# Patient Record
Sex: Female | Born: 1980 | Race: White | Hispanic: No | Smoking: Former smoker
Health system: Southern US, Community
[De-identification: ages and names within clinical notes are randomized; demographics above are authoritative.]

## PROBLEM LIST (undated history)

## (undated) DIAGNOSIS — F419 Anxiety disorder, unspecified: Secondary | ICD-10-CM

## (undated) DIAGNOSIS — J45909 Unspecified asthma, uncomplicated: Secondary | ICD-10-CM

## (undated) HISTORY — PX: OTHER SURGICAL HISTORY: SHX169

---

## 2000-08-20 ENCOUNTER — Other Ambulatory Visit: Admission: RE | Admit: 2000-08-20 | Discharge: 2000-08-20 | Payer: Self-pay | Admitting: Obstetrics and Gynecology

## 2001-09-23 ENCOUNTER — Other Ambulatory Visit: Admission: RE | Admit: 2001-09-23 | Discharge: 2001-09-23 | Payer: Self-pay | Admitting: Obstetrics and Gynecology

## 2001-10-12 ENCOUNTER — Encounter: Payer: Self-pay | Admitting: Emergency Medicine

## 2001-10-12 ENCOUNTER — Emergency Department (HOSPITAL_COMMUNITY): Admission: EM | Admit: 2001-10-12 | Discharge: 2001-10-12 | Payer: Self-pay

## 2002-01-29 ENCOUNTER — Encounter: Payer: Self-pay | Admitting: Emergency Medicine

## 2002-01-29 ENCOUNTER — Emergency Department (HOSPITAL_COMMUNITY): Admission: EM | Admit: 2002-01-29 | Discharge: 2002-01-29 | Payer: Self-pay | Admitting: Emergency Medicine

## 2002-05-24 ENCOUNTER — Emergency Department (HOSPITAL_COMMUNITY): Admission: EM | Admit: 2002-05-24 | Discharge: 2002-05-24 | Payer: Self-pay | Admitting: *Deleted

## 2002-07-14 ENCOUNTER — Other Ambulatory Visit: Admission: RE | Admit: 2002-07-14 | Discharge: 2002-07-14 | Payer: Self-pay | Admitting: Obstetrics & Gynecology

## 2003-07-24 ENCOUNTER — Encounter: Payer: Self-pay | Admitting: Emergency Medicine

## 2003-07-24 ENCOUNTER — Emergency Department (HOSPITAL_COMMUNITY): Admission: EM | Admit: 2003-07-24 | Discharge: 2003-07-24 | Payer: Self-pay | Admitting: Emergency Medicine

## 2003-07-27 ENCOUNTER — Other Ambulatory Visit: Admission: RE | Admit: 2003-07-27 | Discharge: 2003-07-27 | Payer: Self-pay | Admitting: Obstetrics & Gynecology

## 2004-10-24 ENCOUNTER — Other Ambulatory Visit: Admission: RE | Admit: 2004-10-24 | Discharge: 2004-10-24 | Payer: Self-pay | Admitting: Obstetrics and Gynecology

## 2005-03-12 ENCOUNTER — Observation Stay (HOSPITAL_COMMUNITY): Admission: AD | Admit: 2005-03-12 | Discharge: 2005-03-13 | Payer: Self-pay | Admitting: Obstetrics & Gynecology

## 2005-05-03 ENCOUNTER — Inpatient Hospital Stay (HOSPITAL_COMMUNITY): Admission: AD | Admit: 2005-05-03 | Discharge: 2005-05-03 | Payer: Self-pay | Admitting: Obstetrics and Gynecology

## 2005-05-04 ENCOUNTER — Emergency Department (HOSPITAL_COMMUNITY): Admission: EM | Admit: 2005-05-04 | Discharge: 2005-05-04 | Payer: Self-pay | Admitting: Emergency Medicine

## 2005-07-25 ENCOUNTER — Inpatient Hospital Stay (HOSPITAL_COMMUNITY): Admission: AD | Admit: 2005-07-25 | Discharge: 2005-07-29 | Payer: Self-pay | Admitting: Obstetrics & Gynecology

## 2005-08-01 ENCOUNTER — Encounter: Admission: RE | Admit: 2005-08-01 | Discharge: 2005-08-31 | Payer: Self-pay | Admitting: Obstetrics & Gynecology

## 2005-10-30 ENCOUNTER — Other Ambulatory Visit: Admission: RE | Admit: 2005-10-30 | Discharge: 2005-10-30 | Payer: Self-pay | Admitting: Obstetrics and Gynecology

## 2009-12-15 ENCOUNTER — Inpatient Hospital Stay (HOSPITAL_COMMUNITY): Admission: AD | Admit: 2009-12-15 | Discharge: 2009-12-16 | Payer: Self-pay | Admitting: Obstetrics & Gynecology

## 2010-10-28 ENCOUNTER — Inpatient Hospital Stay (HOSPITAL_COMMUNITY): Admission: AD | Admit: 2010-10-28 | Discharge: 2010-10-31 | Payer: Self-pay | Admitting: Obstetrics and Gynecology

## 2010-10-28 ENCOUNTER — Encounter (INDEPENDENT_AMBULATORY_CARE_PROVIDER_SITE_OTHER): Payer: Self-pay | Admitting: Obstetrics and Gynecology

## 2010-11-05 ENCOUNTER — Observation Stay (HOSPITAL_COMMUNITY): Admission: AD | Admit: 2010-11-05 | Discharge: 2010-11-07 | Payer: Self-pay | Admitting: Obstetrics and Gynecology

## 2011-03-13 LAB — DIFFERENTIAL
Basophils Absolute: 0 10*3/uL (ref 0.0–0.1)
Basophils Relative: 0 % (ref 0–1)
Eosinophils Absolute: 0 10*3/uL (ref 0.0–0.7)
Lymphs Abs: 0.8 10*3/uL (ref 0.7–4.0)
Monocytes Absolute: 0.6 10*3/uL (ref 0.1–1.0)
Monocytes Relative: 5 % (ref 3–12)
Neutrophils Relative %: 87 % — ABNORMAL HIGH (ref 43–77)

## 2011-03-13 LAB — URINE CULTURE
Colony Count: NO GROWTH
Culture  Setup Time: 201111061911
Culture: NO GROWTH

## 2011-03-13 LAB — CBC
HCT: 26.6 % — ABNORMAL LOW (ref 36.0–46.0)
Hemoglobin: 8.9 g/dL — ABNORMAL LOW (ref 12.0–15.0)
MCH: 29.6 pg (ref 26.0–34.0)
MCHC: 33.7 g/dL (ref 30.0–36.0)

## 2011-03-13 LAB — URINALYSIS, ROUTINE W REFLEX MICROSCOPIC
Ketones, ur: NEGATIVE mg/dL
Urobilinogen, UA: 0.2 mg/dL (ref 0.0–1.0)
pH: 5.5 (ref 5.0–8.0)

## 2011-03-13 LAB — CULTURE, BLOOD (ROUTINE X 2): Culture: NO GROWTH

## 2011-03-13 LAB — COMPREHENSIVE METABOLIC PANEL
ALT: 22 U/L (ref 0–35)
AST: 16 U/L (ref 0–37)
Alkaline Phosphatase: 103 U/L (ref 39–117)
BUN: 6 mg/dL (ref 6–23)
CO2: 23 mEq/L (ref 19–32)
Chloride: 107 mEq/L (ref 96–112)
GFR calc Af Amer: >60 mL/min (ref >60)
Sodium: 138 mEq/L (ref 135–145)
Total Protein: 6.2 g/dL (ref 6.0–8.3)

## 2011-03-13 LAB — GC/CHLAMYDIA PROBE AMP, GENITAL: Chlamydia, DNA Probe: NEGATIVE

## 2011-03-13 LAB — WOUND CULTURE

## 2011-03-14 LAB — CBC
HCT: 23.5 % — ABNORMAL LOW (ref 36.0–46.0)
HCT: 31.4 % — ABNORMAL LOW (ref 36.0–46.0)
Hemoglobin: 10.4 g/dL — ABNORMAL LOW (ref 12.0–15.0)
Hemoglobin: 8 g/dL — ABNORMAL LOW (ref 12.0–15.0)
MCHC: 33.8 g/dL (ref 30.0–36.0)
MCV: 88.9 fL (ref 78.0–100.0)
Platelets: 133 10*3/uL — ABNORMAL LOW (ref 150–400)
Platelets: 175 10*3/uL (ref 150–400)
RBC: 3.53 MIL/uL — ABNORMAL LOW (ref 3.87–5.11)
RDW: 13.2 % (ref 11.5–15.5)

## 2011-03-24 IMAGING — US US RENAL
1 series · 14 of 25 positions shown · non-contrast
Comparison: None

CLINICAL DATA: Right flank pain and renal infection.

RENAL/URINARY TRACT ULTRASOUND COMPLETE

[Series 1: us renal · 0.24mm/px · 14 of 28 slices shown]
[im 1/28]
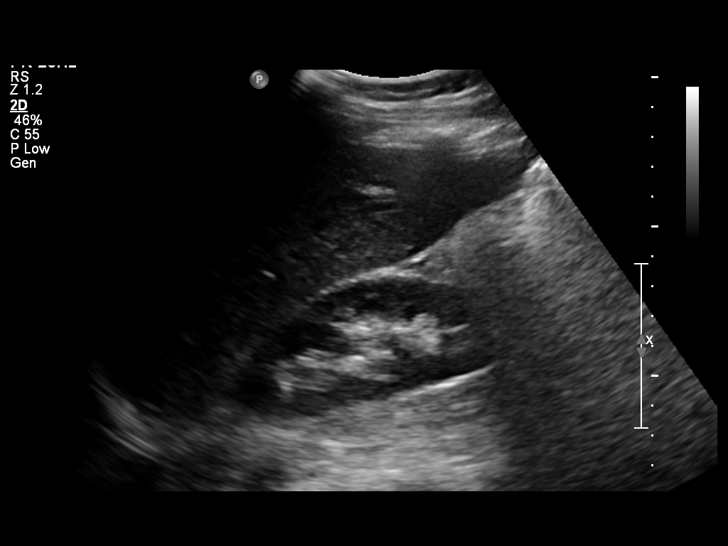
[im 3/28]
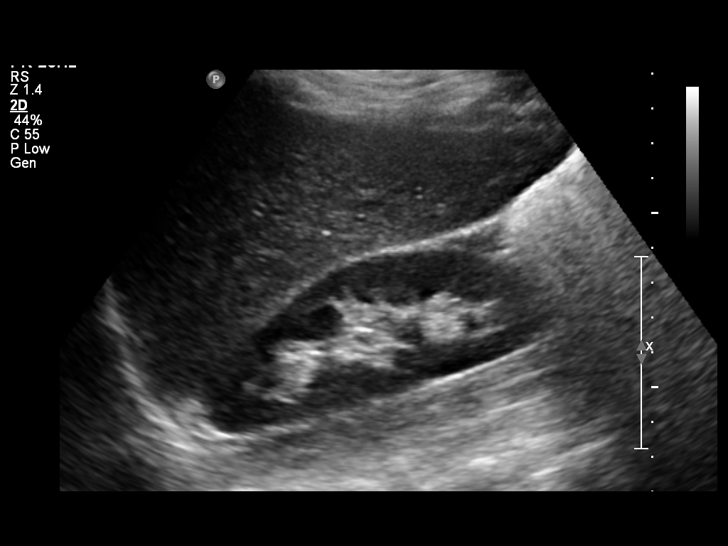
[im 5/28]
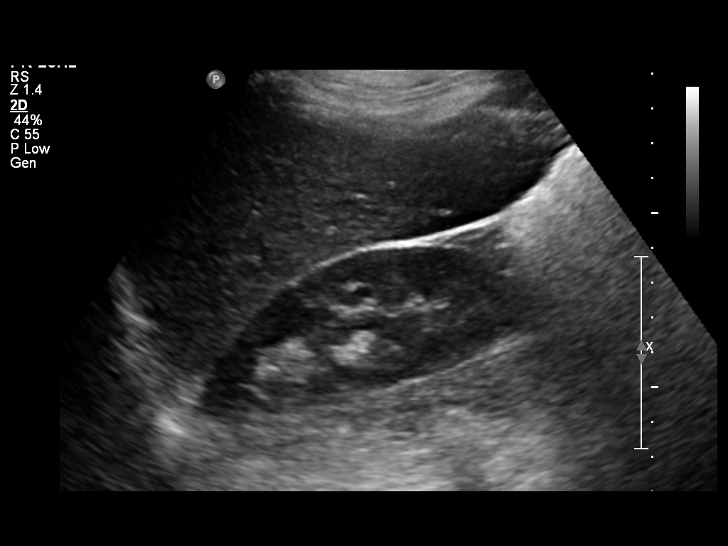
[im 7/28]
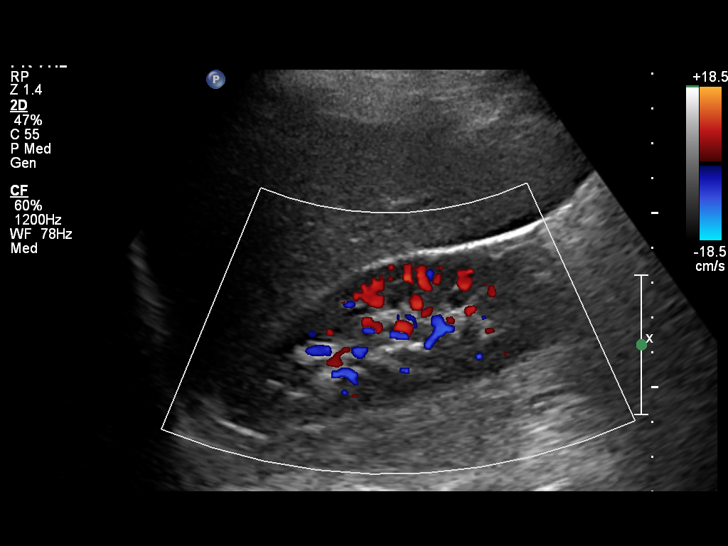
[im 10/28]
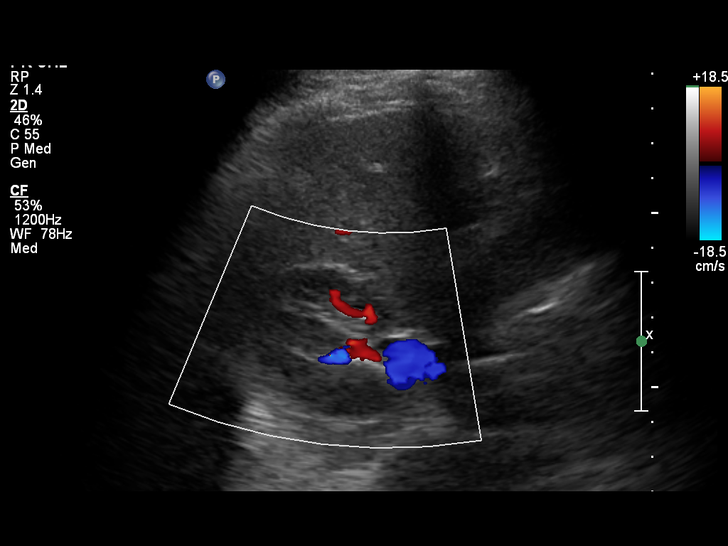
[im 11/28]
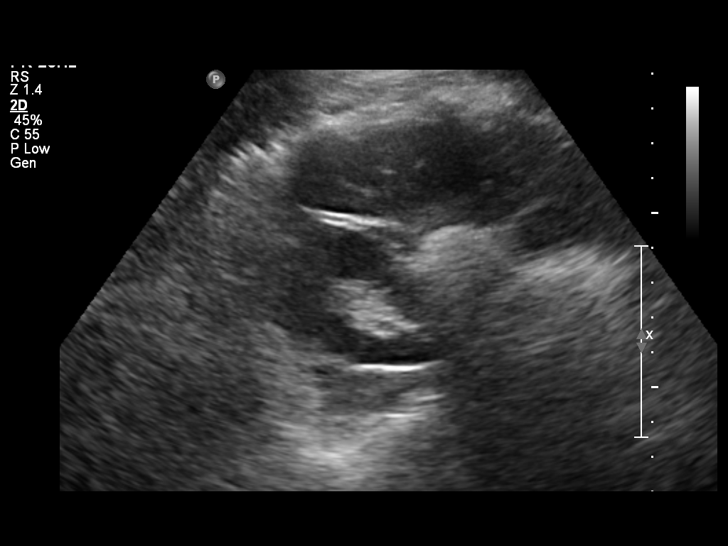
[im 13/28]
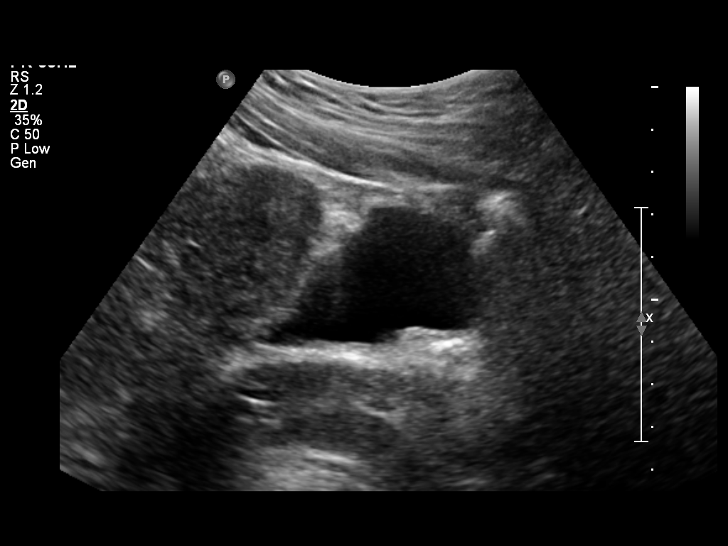
[im 15/28]
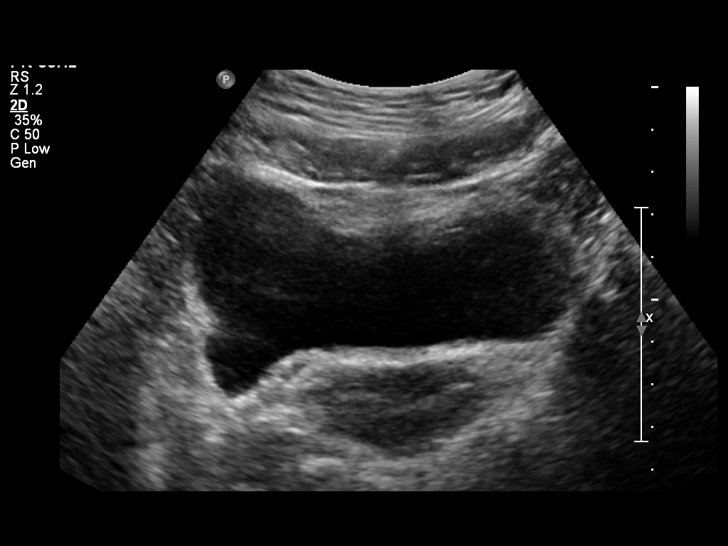
[im 17/28]
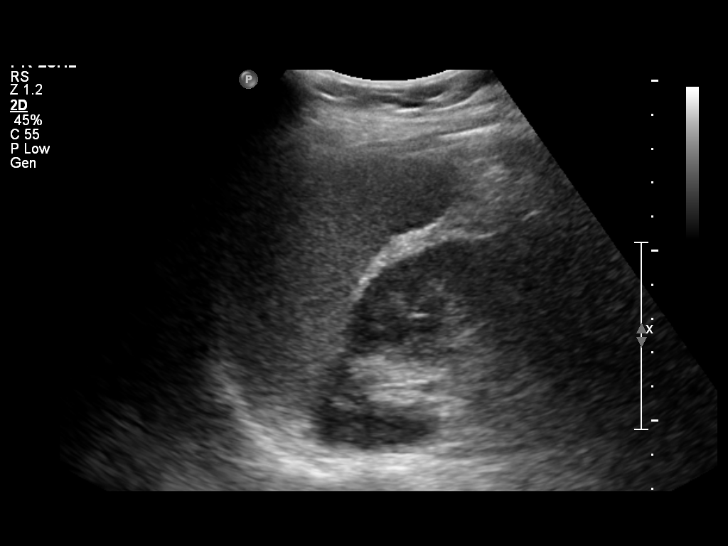
[im 19/28]
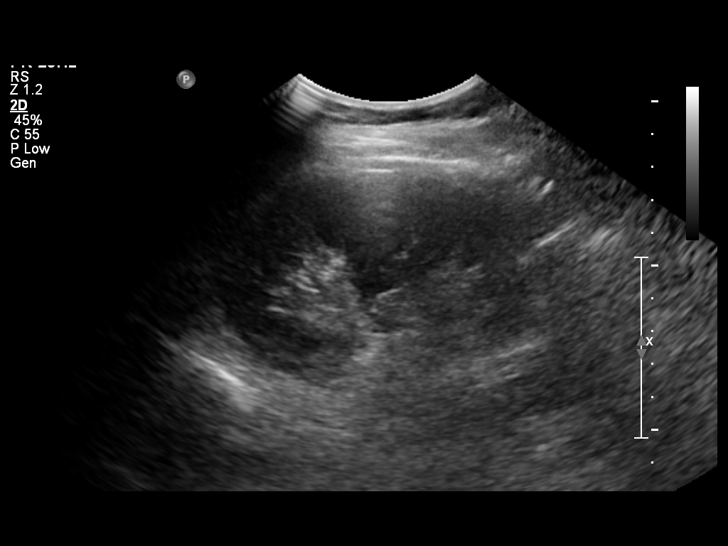
[im 21/28]
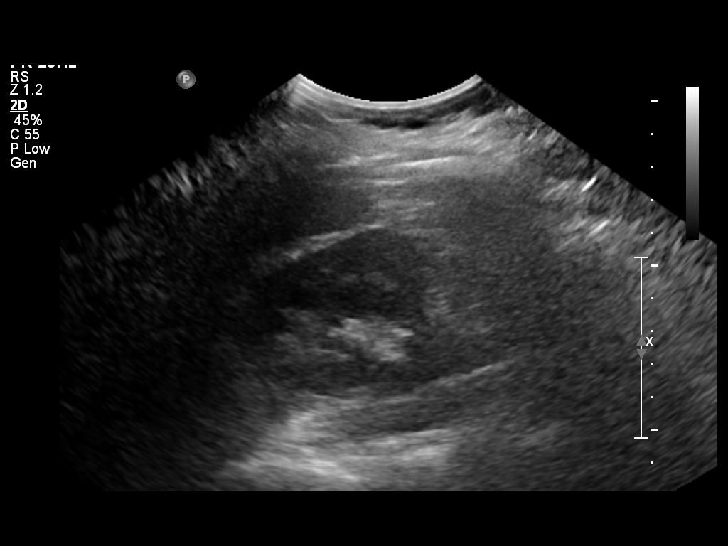
[im 23/28]
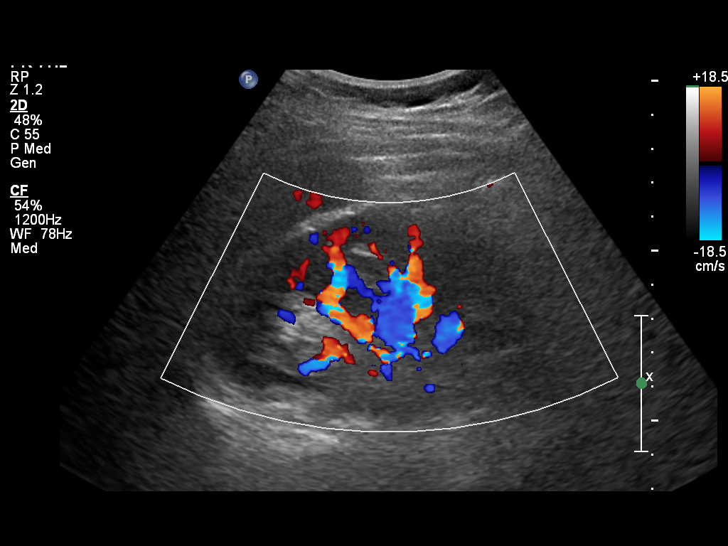
[im 25/28]
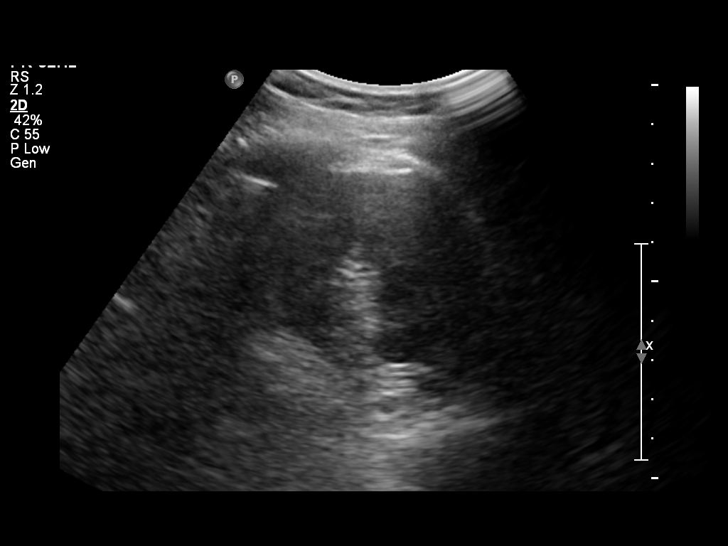
[im 28/28]
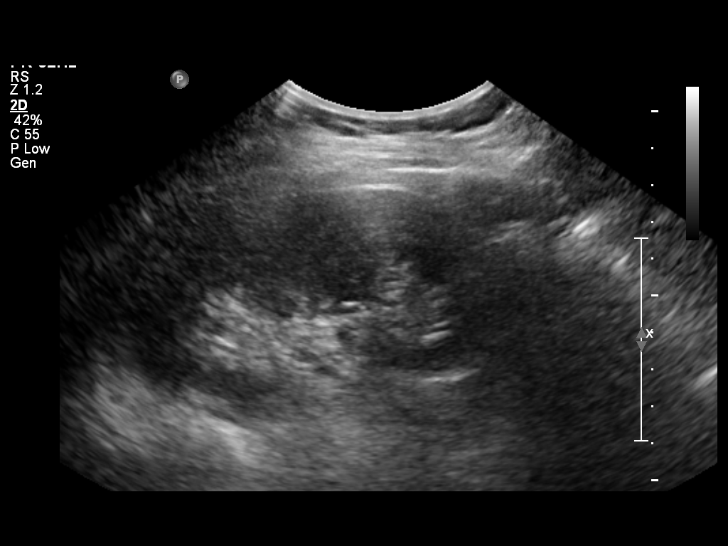

[14 of 25 positions shown; findings below may reference images not displayed]

FINDINGS: Kidneys:  The kidneys bilaterally are normal in echogenicity and
size.
Both kidneys measure 10.4 cm in greatest longitudinal dimension.
There is no evidence of hydronephrosis, solid renal masses, or
definite renal calculi.

Bladder:  The bladder is normal for degree of filling.
Bilateral ureteral jets are identified.
IMPRESSION: Unremarkable renal ultrasound.

## 2011-03-24 IMAGING — US US PELVIS COMPLETE MODIFY
1 series · 14 of 25 positions shown · non-contrast
Comparison: None

CLINICAL DATA: 29-year-old female with pelvic pain.

TRANSABDOMINAL AND TRANSVAGINAL ULTRASOUND OF PELVIS
TECHNIQUE: Both transabdominal and transvaginal ultrasound
examinations of the pelvis were performed including evaluation of
the uterus, ovaries, adnexal regions, and pelvic cul-de-sac.

[Series 1: us pelvis complete modify · 0.24mm/px · 14 of 59 slices shown]
[im 1/59]
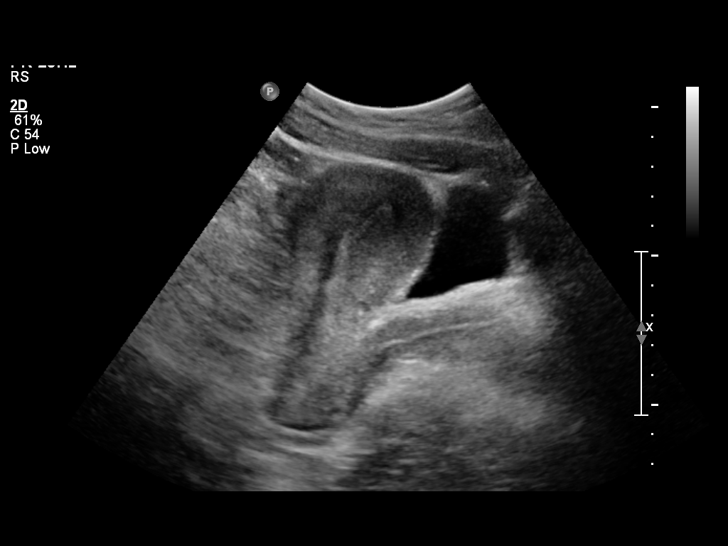
[im 5/59]
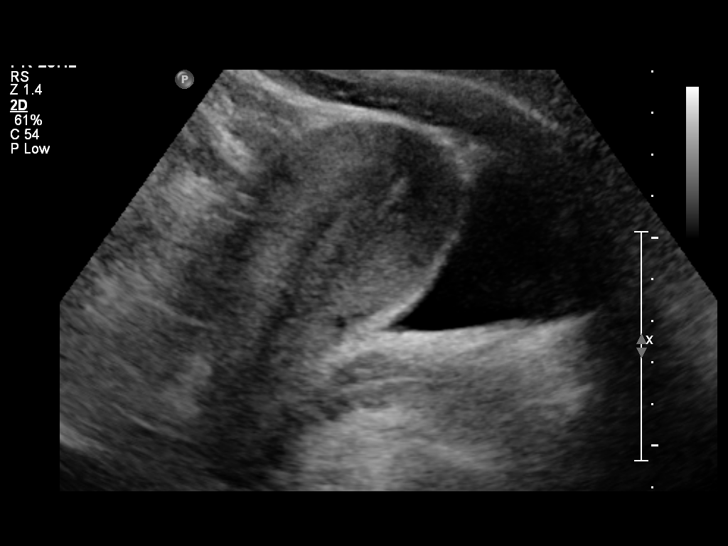
[im 10/59]
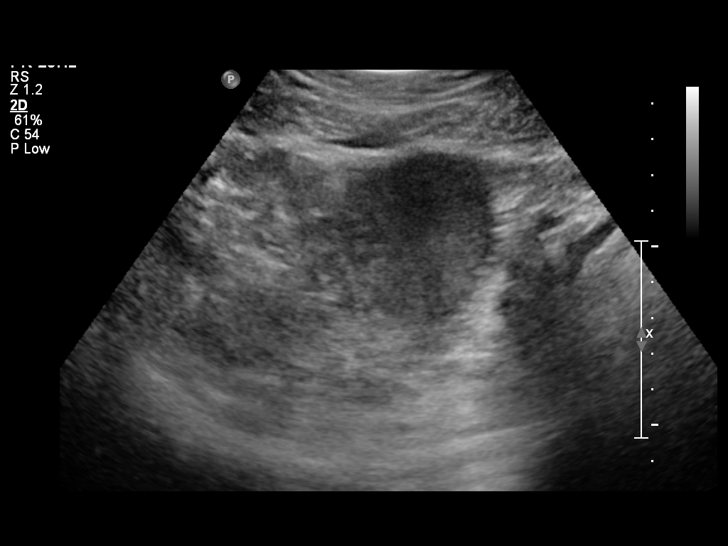
[im 15/59]
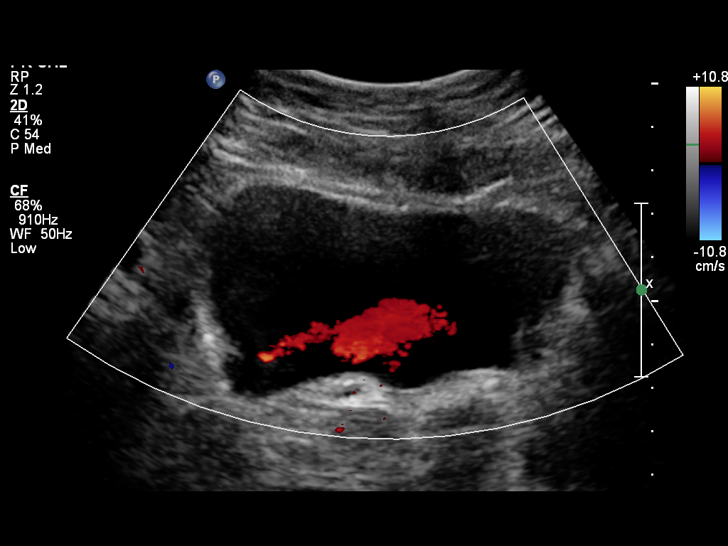
[im 20/59]
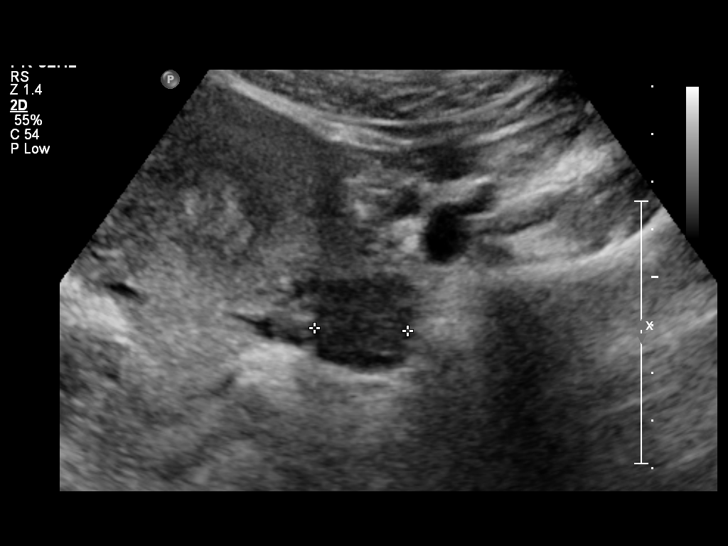
[im 22/59]
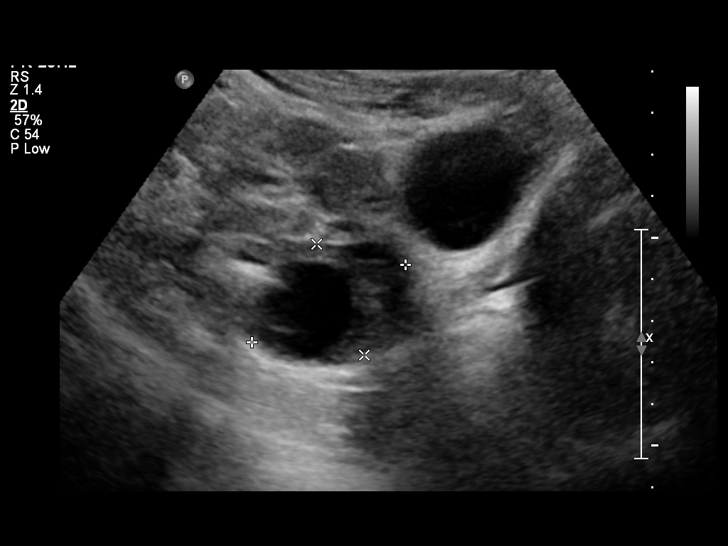
[im 27/59]
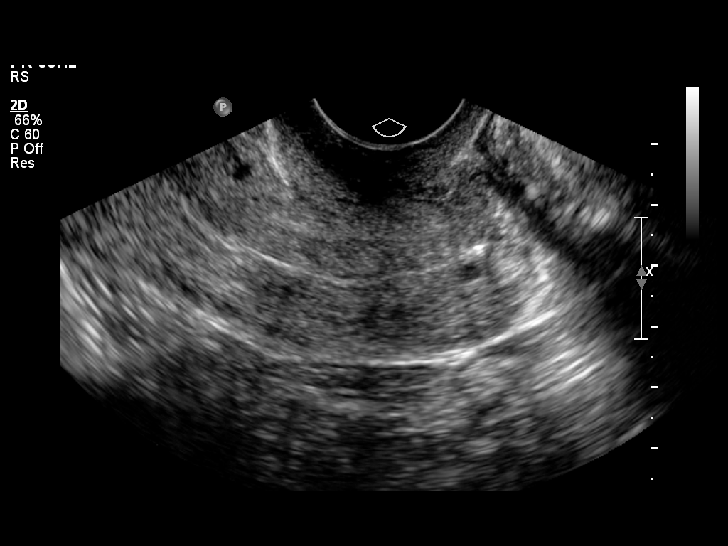
[im 32/59]
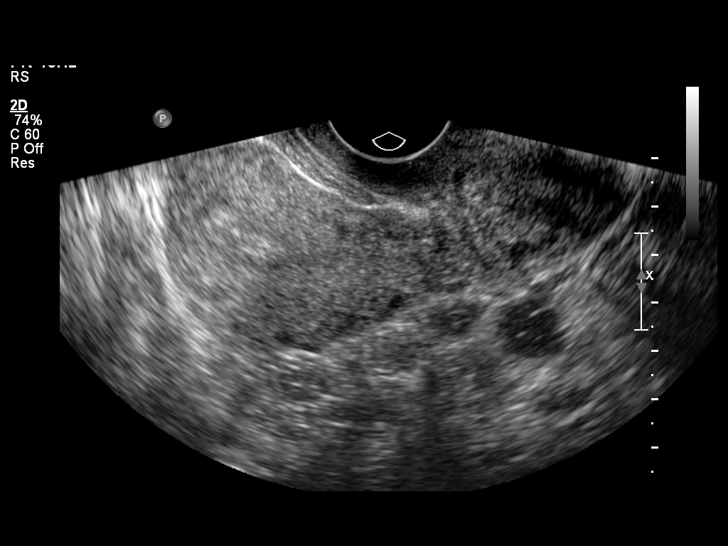
[im 37/59]
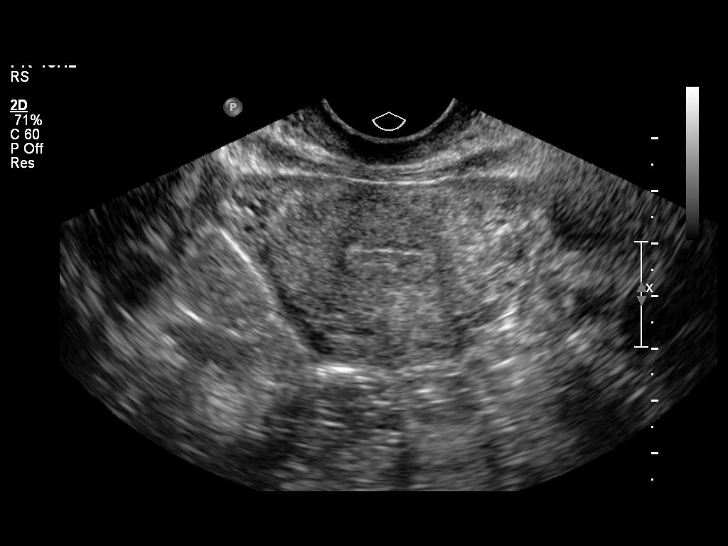
[im 39/59]
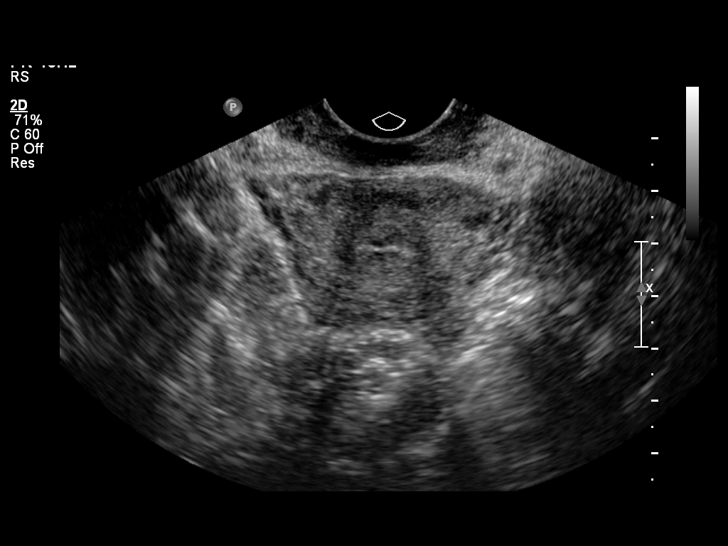
[im 44/59]
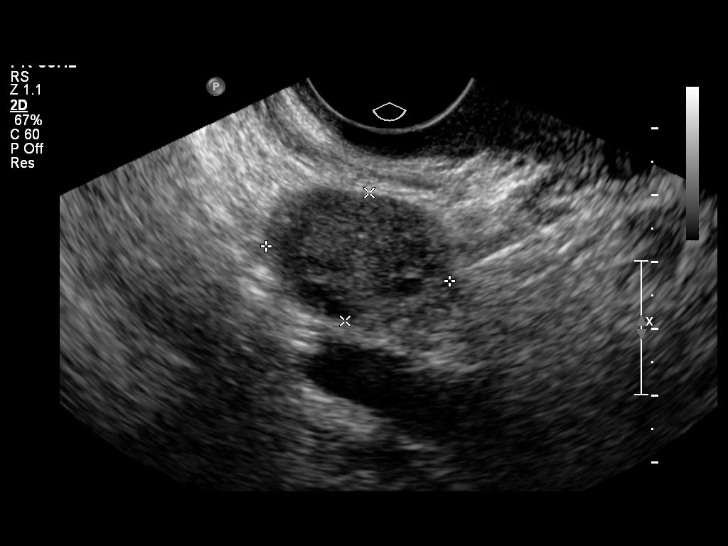
[im 49/59]
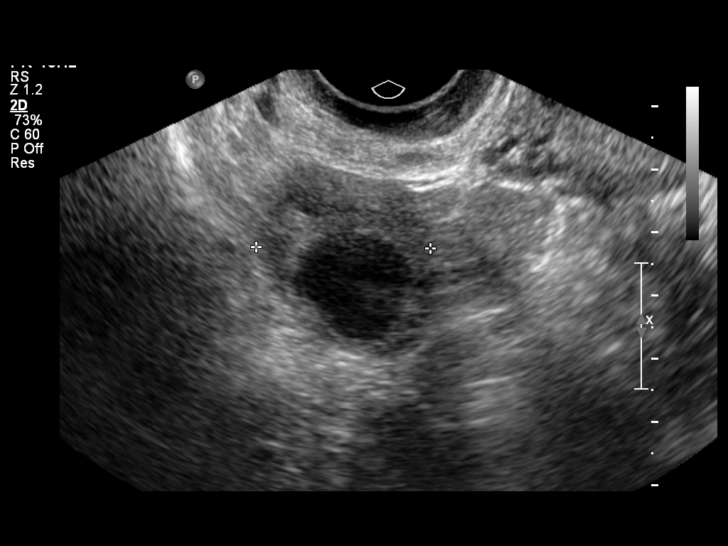
[im 54/59]
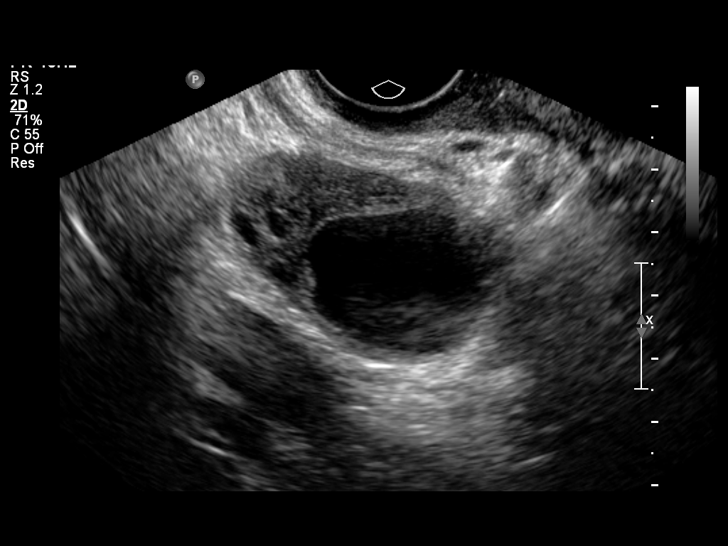
[im 59/59]
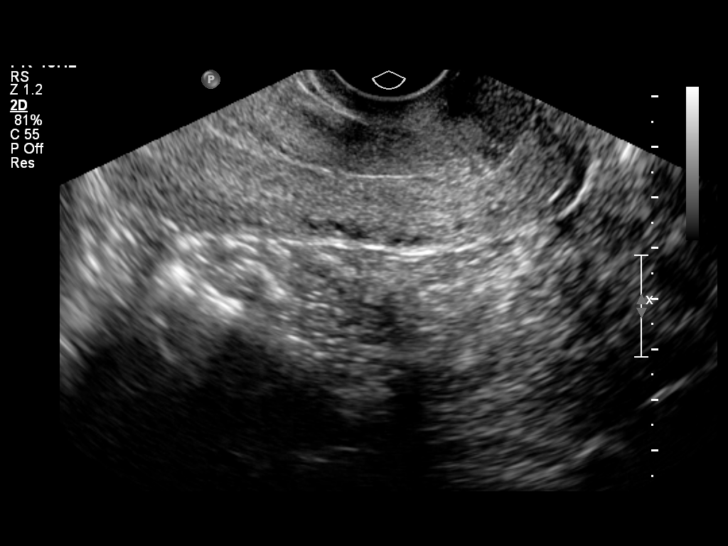

[14 of 25 positions shown; findings below may reference images not displayed]

FINDINGS: Uterus is anteverted and unremarkable measuring 9.4 x 4.6 x 5.6 cm.
No focal uterine lesions are identified.

Endometrium is trilaminar measuring 10 mm in diameter.

Right Ovary contains a 2.9 x 2.7 cm cyst / dominant follicle.  The
right ovary is otherwise unremarkable.

Left Ovary is unremarkable.

Other Findings:  There is no evidence of adnexal mass or free
fluid.
IMPRESSION: Unremarkable pelvic ultrasound except for 2.9 x 2.7 cm right
ovarian cyst / dominant follicle.

## 2011-04-02 LAB — COMPREHENSIVE METABOLIC PANEL
Alkaline Phosphatase: 52 U/L (ref 39–117)
CO2: 27 mEq/L (ref 19–32)
Calcium: 9 mg/dL (ref 8.4–10.5)
GFR calc Af Amer: >60 mL/min (ref >60)
Glucose, Bld: 83 mg/dL (ref 70–99)
Potassium: 4.1 mEq/L (ref 3.5–5.1)

## 2011-04-02 LAB — WET PREP, GENITAL: Yeast Wet Prep HPF POC: NONE SEEN

## 2011-04-02 LAB — GC/CHLAMYDIA PROBE AMP, GENITAL
Chlamydia, DNA Probe: NEGATIVE
GC Probe Amp, Genital: NEGATIVE

## 2011-04-02 LAB — CBC
HCT: 36.4 % (ref 36.0–46.0)
Hemoglobin: 12.5 g/dL (ref 12.0–15.0)
MCHC: 34.3 g/dL (ref 30.0–36.0)
MCV: 88.8 fL (ref 78.0–100.0)
RBC: 4.1 MIL/uL (ref 3.87–5.11)
RDW: 12.2 % (ref 11.5–15.5)
WBC: 6.7 10*3/uL (ref 4.0–10.5)

## 2011-04-02 LAB — URINALYSIS, ROUTINE W REFLEX MICROSCOPIC
Bilirubin Urine: NEGATIVE
Nitrite: NEGATIVE
Protein, ur: NEGATIVE mg/dL
Urobilinogen, UA: 0.2 mg/dL (ref 0.0–1.0)

## 2012-05-25 ENCOUNTER — Encounter (HOSPITAL_COMMUNITY): Payer: Self-pay | Admitting: *Deleted

## 2012-05-25 ENCOUNTER — Emergency Department (HOSPITAL_COMMUNITY)
Admission: EM | Admit: 2012-05-25 | Discharge: 2012-05-25 | Disposition: A | Discharge status: Home / Self Care | Payer: Self-pay | Attending: Emergency Medicine | Admitting: Emergency Medicine

## 2012-05-25 DIAGNOSIS — R109 Unspecified abdominal pain: Secondary | ICD-10-CM | POA: Insufficient documentation

## 2012-05-25 DIAGNOSIS — K625 Hemorrhage of anus and rectum: Secondary | ICD-10-CM | POA: Insufficient documentation

## 2012-05-25 LAB — CBC
HCT: 40.8 % (ref 36.0–46.0)
MCHC: 35.3 g/dL (ref 30.0–36.0)
MCV: 86.8 fL (ref 78.0–100.0)
RDW: 12.5 % (ref 11.5–15.5)

## 2012-05-25 LAB — URINALYSIS, ROUTINE W REFLEX MICROSCOPIC
Bilirubin Urine: NEGATIVE
Glucose, UA: NEGATIVE mg/dL
Ketones, ur: NEGATIVE mg/dL
Protein, ur: 30 mg/dL — AB

## 2012-05-25 LAB — COMPREHENSIVE METABOLIC PANEL
AST: 14 U/L (ref 0–37)
Albumin: 3.9 g/dL (ref 3.5–5.2)
BUN: 13 mg/dL (ref 6–23)
CO2: 24 mEq/L (ref 19–32)
Calcium: 9.1 mg/dL (ref 8.4–10.5)
Creatinine, Ser: 0.79 mg/dL (ref 0.50–1.10)
GFR calc non Af Amer: >90 mL/min (ref >90)

## 2012-05-25 LAB — DIFFERENTIAL
Basophils Absolute: 0 10*3/uL (ref 0.0–0.1)
Basophils Relative: 0 % (ref 0–1)
Eosinophils Relative: 2 % (ref 0–5)
Monocytes Absolute: 0.9 10*3/uL (ref 0.1–1.0)

## 2012-05-25 LAB — SAMPLE TO BLOOD BANK

## 2012-05-25 LAB — URINE MICROSCOPIC-ADD ON

## 2012-05-25 MED ORDER — ONDANSETRON 4 MG PO TBDP: ORAL_TABLET | ORAL | Status: AC

## 2012-05-25 NOTE — Discharge Instructions (Signed)
Bloody Stools Bloody stools often mean that there is a problem in the digestive tract. Your caregiver may use the term "melena" to describe black, tarry, and bad smelling stools or "hematochezia" to describe red or maroon-colored stools. Blood seen in the stool can be caused by bleeding anywhere along the intestinal tract.  A black stool usually means that blood is coming from the upper part of the gastrointestinal tract (esophagus, stomach, or small bowel). Passing maroon-colored stools or bright red blood usually means that blood is coming from lower down in the large bowel or the rectum. However, sometimes massive bleeding in the stomach or small intestine can cause bright red bloody stools.  Consuming black licorice, lead, iron pills, medicines containing bismuth subsalicylate, or blueberries can also cause black stools. Your caregiver can test black stools to see if blood is present. It is important that the cause of the bleeding be found. Treatment can then be started, and the problem can be corrected. Rectal bleeding may not be serious, but you should not assume everything is okay until you know the cause.It is very important to follow up with your caregiver or a specialist in gastrointestinal problems. CAUSES  Blood in the stools can come from various underlying causes.Often, the cause is not found during your first visit. Testing is often needed to discover the cause of bleeding in the gastrointestinal tract. Causes range from simple to serious or even life-threatening.Possible causes include:  Hemorrhoids.These are veins that are full of blood (engorged) in the rectum. They cause pain, inflammation, and may bleed.   Anal fissures.These are areas of painful tearing which may bleed. They are often caused by passing hard stool.   Diverticulosis.These are pouches that form on the colon over time, with age, and may bleed significantly.   Diverticulitis.This is inflammation in areas with  diverticulosis. It can cause pain, fever, and bloody stools, although bleeding is rare.   Proctitis and colitis. These are inflamed areas of the rectum or colon. They may cause pain, fever, and bloody stools.   Polyps and cancer. Colon cancer is a leading cause of preventable cancer death.It often starts out as precancerous polyps that can be removed during a colonoscopy, preventing progression into cancer. Sometimes, polyps and cancer may cause rectal bleeding.   Gastritis and ulcers.Bleeding from the upper gastrointestinal tract (near the stomach) may travel through the intestines and produce black, sometimes tarry, often bad smelling stools. In certain cases, if the bleeding is fast enough, the stools may not be black, but red and the condition may be life-threatening.  SYMPTOMS  You may have stools that are bright red and bloody, that are normal color with blood on them, or that are dark black and tarry. In some cases, you may only have blood in the toilet bowl. Any of these cases need medical care. You may also have:  Pain at the anus or anywhere in the rectum.   Lightheadedness or feeling faint.   Extreme weakness.   Nausea or vomiting.   Fever.  DIAGNOSIS Your caregiver may use the following methods to find the cause of your bleeding:  Taking a medical history. Age is important. Older people tend to develop polyps and cancer more often. If there is anal pain and a hard, large stool associated with bleeding, a tear of the anus may be the cause. If blood drips into the toilet after a bowel movement, bleeding hemorrhoids may be the problem. The color and frequency of the bleeding are additional considerations.   In most cases, the medical history provides clues, but seldom the final answer.   A visual and finger (digital) exam. Your caregiver will inspect the anal area, looking for tears and hemorrhoids. A finger exam can provide information when there is tenderness or a growth inside. In  men, the prostate is also examined.   Endoscopy. Several types of small, long scopes (endoscopes) are used to view the colon.   In the office, your caregiver may use a rigid, or more commonly, a flexible viewing sigmoidoscope. This exam is called flexible sigmoidoscopy. It is performed in 5 to 10 minutes.   A more thorough exam is accomplished with a colonoscope. It allows your caregiver to view the entire 5 to 6 foot long colon. Medicine to help you relax (sedative) is usually given for this exam. Frequently, a bleeding lesion may be present beyond the reach of the sigmoidoscope. So, a colonoscopy may be the best exam to start with. Both exams are usually done on an outpatient basis. This means the patient does not stay overnight in the hospital or surgery center.   An upper endoscopy may be needed to examine your stomach. Sedation is used and a flexible endoscope is put in your mouth, down to your stomach.   A barium enema X-ray. This is an X-ray exam. It uses liquid barium inserted by enema into the rectum. This test alone may not identify an actual bleeding point. X-rays highlight abnormal shadows, such as those made by lumps (tumors), diverticuli, or colitis.  TREATMENT  Treatment depends on the cause of your bleeding.   For bleeding from the stomach or colon, the caregiver doing your endoscopy or colonoscopy may be able to stop the bleeding as part of the procedure.   Inflammation or infection of the colon can be treated with medicines.   Many rectal problems can be treated with creams, suppositories, or warm baths.   Surgery is sometimes needed.   Blood transfusions are sometimes needed if you have lost a lot of blood.   For any bleeding problem, let your caregiver know if you take aspirin or other blood thinners regularly.  HOME CARE INSTRUCTIONS   Take any medicines exactly as prescribed.   Keep your stools soft by eating a diet high in fiber. Prunes (1 to 3 a day) work well for  many people.   Drink enough water and fluids to keep your urine clear or pale yellow.   Take sitz baths if advised. A sitz bath is when you sit in a bathtub with warm water for 10 to 15 minutes to soak, soothe, and cleanse the rectal area.   If enemas or suppositories are advised, be sure you know how to use them. Tell your caregiver if you have problems with this.   Monitor your bowel movements to look for signs of improvement or worsening.  SEEK MEDICAL CARE IF:   You do not improve in the time expected.   Your condition worsens after initial improvement.   You develop any new symptoms.  SEEK IMMEDIATE MEDICAL CARE IF:   You develop severe or prolonged rectal bleeding.   You vomit blood.   You feel weak or faint.   You have a fever.  MAKE SURE YOU:  Understand these instructions.   Will watch your condition.   Will get help right away if you are not doing well or get worse.  Document Released: 12/07/2002 Document Revised: 12/06/2011 Document Reviewed: 05/04/2011 [employer] Patient Information 2012 [employer].Rectal Bleeding    Rectal bleeding is when blood comes out of the opening of the butt (anus). Rectal bleeding may show up as bright red blood or really dark poop (stool). The poop may look dark red, maroon, or black. Rectal bleeding is often a sign that something is wrong. This needs to be checked by a doctor.  HOME CARE  Eat a diet high in fiber. This will help keep your poop soft.   Limit activitiy.   Drink enough fluids to keep your pee (urine) clear or pale yellow.   Take a warm bath to soothe any pain.   Follow up with your doctor as told.  GET HELP RIGHT AWAY IF:  You have more bleeding.   You have black or dark red poop.   You throw up (vomit) blood or it looks like coffee grounds.   You have belly (abdominal) pain or tenderness.   You have a fever.   You feel weak, sick to your stomach (nauseous), or you pass out (faint).   You have pain that  is so bad you cannot poop (bowel movement).  MAKE SURE YOU:  Understand these instructions.   Will watch your condition.   Will get help right away if you are not doing well or get worse.  Document Released: 08/29/2011 Document Revised: 12/06/2011 Document Reviewed: 08/29/2011 [employer] Patient Information 2012 [employer]. 

## 2012-05-25 NOTE — ED Notes (Signed)
abd pain since this am with nv and bloody diarhea.  More clots also.  lmp today

## 2012-05-25 NOTE — ED Provider Notes (Signed)
History     CSN: [id number]  Arrival date & time 05/25/12  1944   First MD Initiated Contact with Patient 05/25/12 2124      Chief Complaint  Patient presents with  . Abdominal Pain    (Consider location/radiation/quality/duration/timing/severity/associated sxs/prior treatment) HPI Comments: Pt with abdominal cramping and BRBPR today.  No lightheadedness or weakness.  Has vomited several times with no blood in emesis.  Otherwise doing well.  Patient is a 32 y.o. female presenting with hematochezia. The history is provided by the patient.  Rectal Bleeding  The current episode started today. The problem occurs occasionally. The problem has been unchanged. The pain is mild (lower abdominal cramping). The stool is described as soft and mixed with blood. There was no prior successful therapy. There was no prior unsuccessful therapy. Pertinent negatives include no abdominal pain, no chest pain and no rash. She has been behaving normally.    History reviewed. No pertinent past medical history.  History reviewed. No pertinent past surgical history.  No family history on file.  History  Substance Use Topics  . Smoking status: Current Everyday Smoker  . Smokeless tobacco: Not on file  . Alcohol Use: Yes    OB History    Grav Para Term Preterm Abortions TAB SAB Ect Mult Living                  Review of Systems  Constitutional: Negative for activity change.  HENT: Negative for congestion.   Eyes: Negative for visual disturbance.  Respiratory: Negative for chest tightness and shortness of breath.   Cardiovascular: Negative for chest pain and leg swelling.  Gastrointestinal: Positive for hematochezia. Negative for abdominal pain.  Genitourinary: Negative for dysuria.  Skin: Negative for rash.  Neurological: Negative for syncope.  Psychiatric/Behavioral: Negative for behavioral problems.    Allergies  Shellfish allergy and Codeine  Home Medications   Current Outpatient Rx   Name Route Sig Dispense Refill  . BC HEADACHE POWDER PO Oral Take 1 Package by mouth daily.    . IBUPROFEN 400 MG PO TABS Oral Take 400 mg by mouth every 6 (six) hours as needed. For pain    . LEVONORGESTREL-ETHINYL ESTRAD 0.1-20 MG-MCG PO TABS Oral Take 1 tablet by mouth daily.    Marland Kitchen ONDANSETRON 4 MG PO TBDP  4mg  ODT q4 hours prn nausea/vomit 12 tablet 0    BP 116/77  Pulse 77  Temp(Src) 98.5 F (36.9 C) (Oral)  Resp 16  SpO2 99%  LMP 05/25/2012  Physical Exam  Constitutional: She is oriented to person, place, and time. She appears well-developed and well-nourished.  HENT:  Head: Normocephalic and atraumatic.  Eyes: Conjunctivae and EOM are normal. Pupils are equal, round, and reactive to light. No scleral icterus.  Neck: Normal range of motion. Neck supple.  Cardiovascular: Normal rate and regular rhythm.  Exam reveals no gallop and no friction rub.   No murmur heard. Pulmonary/Chest: Effort normal and breath sounds normal. No respiratory distress. She has no wheezes. She has no rales. She exhibits no tenderness.  Abdominal: Soft. She exhibits no distension and no mass. There is tenderness (mild lower abdominal ttp diffusely). There is no rebound and no guarding.  Genitourinary:       Gross blood on rectal exam  Musculoskeletal: Normal range of motion.  Neurological: She is alert and oriented to person, place, and time. She has normal reflexes. No cranial nerve deficit.  Skin: Skin is warm and dry. No rash noted.  Psychiatric: She has a normal mood and affect. Her behavior is normal. Judgment and thought content normal.    ED Course  Procedures (including critical care time)  Labs Reviewed  URINALYSIS, ROUTINE W REFLEX MICROSCOPIC - Abnormal; Notable for the following:    Color, Urine AMBER (*) BIOCHEMICALS MAY BE AFFECTED BY COLOR   APPearance CLOUDY (*)    Hgb urine dipstick LARGE (*)    Protein, ur 30 (*)    Leukocytes, UA SMALL (*)    All other components within  normal limits  CBC - Abnormal; Notable for the following:    WBC 15.5 (*)    All other components within normal limits  DIFFERENTIAL - Abnormal; Notable for the following:    Neutro Abs 11.7 (*)    All other components within normal limits  COMPREHENSIVE METABOLIC PANEL - Abnormal; Notable for the following:    Glucose, Bld 102 (*)    All other components within normal limits  URINE MICROSCOPIC-ADD ON - Abnormal; Notable for the following:    Squamous Epithelial / LPF FEW (*)    Bacteria, UA MANY (*)    All other components within normal limits  SAMPLE TO BLOOD BANK  PREGNANCY, URINE   No results found.   1. Rectal bleed       MDM  Pt with abdominal cramping and BRBPR today.  No lightheadedness or weakness.  Has vomited several times with no blood in emesis.  Otherwise doing well.  VSS and well appearing.  No clinical evidence of anemia.  Hb 14.  UA with bacteria but no UTI symptoms - will; not treat.  Leukocytosis, vomiting, diarrhea with blood.  Suspect gastroenteritis with secondary bleeding.  Pt brought pictures of bloody BMs to ED - minimal blood in toilet.  Exam not suggestive of appy, ovarian torsion or other acute intraabdominal pathology.  Pt declined pelvic exam.  Gave info for GI f/u if symptoms continue.  Pt comfortable with plan and will follow up.           Barbara Chaco, MD 05/25/12 2231

## 2012-05-25 NOTE — ED Notes (Signed)
Pt states that she has been having abdominal pain. Pt states that she has been having a sharp cramping and intermittant pain in her abdomen and then she feels like she has to go to the bathroom. Pt has small bowel movements with bright red blood in them. Pt denies irregular bowel movements in the past. Pt states she has irregular periods and recently she started bleeding vaginally as well. Pt denies N/V. Pt has bowel sounds present in all quadrants.

## 2012-05-26 NOTE — ED Provider Notes (Signed)
I  reviewed the resident's note and I agree with the findings and plan.     Nelia Shi, MD 05/26/12 [phone]

## 2012-07-14 ENCOUNTER — Encounter (HOSPITAL_COMMUNITY): Payer: Self-pay | Admitting: *Deleted

## 2012-07-14 ENCOUNTER — Emergency Department (INDEPENDENT_AMBULATORY_CARE_PROVIDER_SITE_OTHER)
Admission: EM | Admit: 2012-07-14 | Discharge: 2012-07-14 | Disposition: A | Discharge status: Home / Self Care | Payer: Self-pay | Source: Home / Self Care

## 2012-07-14 DIAGNOSIS — L738 Other specified follicular disorders: Secondary | ICD-10-CM

## 2012-07-14 DIAGNOSIS — L678 Other hair color and hair shaft abnormalities: Secondary | ICD-10-CM

## 2012-07-14 DIAGNOSIS — L739 Follicular disorder, unspecified: Secondary | ICD-10-CM

## 2012-07-14 HISTORY — DX: Unspecified asthma, uncomplicated: J45.909

## 2012-07-14 MED ORDER — MUPIROCIN 2 % EX OINT: TOPICAL_OINTMENT | Freq: Three times a day (TID) | CUTANEOUS | Status: AC

## 2012-07-14 MED ORDER — DOXYCYCLINE HYCLATE 100 MG PO TABS: 100.0000 mg | ORAL_TABLET | Freq: Two times a day (BID) | ORAL | Status: AC

## 2012-07-14 NOTE — ED Provider Notes (Signed)
Barbara Patel is a 32 y.o. female who presents to Urgent Care today for painful bumps in her axilla. This developed over the last few weeks. Initially became so swollen and red that it spontaneously ruptured and drained pus.  She took some leftover Keflex and which helped until it ran out.  She has been shaving and has spread her infection to both axilla.  She denies any fevers or chills and feels well otherwise without any other rashes.  No other people in the household have similar rashes.   PMH reviewed. Otherwise healthy History  Substance Use Topics  . Smoking status: Current Everyday Smoker  . Smokeless tobacco: Not on file  . Alcohol Use: Yes   ROS as above Medications reviewed. No current facility-administered medications for this encounter.   Current Outpatient Prescriptions  Medication Sig Dispense Refill  . Aspirin-Salicylamide-Caffeine (BC HEADACHE POWDER PO) Take 1 Package by mouth daily.      Marland Kitchen doxycycline (VIBRA-TABS) 100 MG tablet Take 1 tablet (100 mg total) by mouth 2 (two) times daily.  28 tablet  0  . ibuprofen (ADVIL,MOTRIN) 400 MG tablet Take 400 mg by mouth every 6 (six) hours as needed. For pain      . levonorgestrel-ethinyl estradiol (SRONYX) 0.1-20 MG-MCG tablet Take 1 tablet by mouth daily.      . mupirocin ointment (BACTROBAN) 2 % Apply topically 3 (three) times daily.  22 g  3    Exam:  BP 136/95  Pulse 86  Temp 99.2 F (37.3 C) (Oral)  Resp 18  SpO2 99% Gen: Well NAD SKIN: Tender erythematous papules in bilateral axilla. Some areas of induration. No large areas of fluctuance or discharge noted.   No results found for this or any previous visit (from the past 24 hour(s)). No results found.  Assessment and Plan: 33 y.o. female with folliculitis and bilateral axilla. Plan to treat with oral doxycycline for up to 2 weeks and topical mupirocin as needed. Provided to patient informational handout on folliculitis. Discussed warning signs or symptoms.  Please see discharge instructions. Patient expresses understanding.     Rodolph Bong, MD 07/14/12 [phone]

## 2012-07-14 NOTE — ED Provider Notes (Signed)
Medical screening examination/treatment/procedure(s) were performed by non-physician practitioner and as supervising physician I was immediately available for consultation/collaboration.  Leslee Home, M.D.   Reuben Likes, MD 07/14/12 2128

## 2012-07-14 NOTE — ED Notes (Signed)
Describes swollen axillary lymph nodes and purulent abscesses to bilat axillae since June 1, which have now results in small, red, "bumps".  Has tried Lotrimin cream, cortisone cream, Calamine lotion, vinegar without relief.

## 2014-10-22 ENCOUNTER — Encounter (HOSPITAL_COMMUNITY): Payer: Self-pay | Admitting: Emergency Medicine

## 2014-10-22 ENCOUNTER — Emergency Department (INDEPENDENT_AMBULATORY_CARE_PROVIDER_SITE_OTHER)
Admission: EM | Admit: 2014-10-22 | Discharge: 2014-10-22 | Disposition: A | Discharge status: Home / Self Care | Payer: Self-pay | Source: Home / Self Care | Attending: Emergency Medicine | Admitting: Emergency Medicine

## 2014-10-22 DIAGNOSIS — L089 Local infection of the skin and subcutaneous tissue, unspecified: Secondary | ICD-10-CM

## 2014-10-22 DIAGNOSIS — S30871A Other superficial bite of abdominal wall, initial encounter: Secondary | ICD-10-CM

## 2014-10-22 DIAGNOSIS — S30861A Insect bite (nonvenomous) of abdominal wall, initial encounter: Principal | ICD-10-CM

## 2014-10-22 MED ORDER — SULFAMETHOXAZOLE-TRIMETHOPRIM 800-160 MG PO TABS: 1.0000 | ORAL_TABLET | Freq: Two times a day (BID) | ORAL | Status: DC

## 2014-10-22 NOTE — ED Provider Notes (Signed)
Medical screening examination/treatment/procedure(s) were performed by non-physician practitioner and as supervising physician I was immediately available for consultation/collaboration.  Kasson Lamere, M.D.  Demetrick Eichenberger C Ayushi Pla, MD 10/22/14 1947 

## 2014-10-22 NOTE — Discharge Instructions (Signed)
Return Sunday for a recheck or sooner if you are getting much worse.

## 2014-10-22 NOTE — ED Notes (Signed)
Patient c/o insect bite on right side of lower abdomen x 2-3 days ago. Area is warm to the touch, red and inflamed. Patient reports she has had muscle cramps and tightness since the bite. Patient has been using Bacitracin ointment on bite. Patient is alert and oriented and in NAD.

## 2014-10-22 NOTE — ED Provider Notes (Signed)
CSN: [id number]     Arrival date & time 10/22/14  1908 History   First MD Initiated Contact with Patient 10/22/14 1935     Chief Complaint  Patient presents with  . Insect Bite   (Consider location/radiation/quality/duration/timing/severity/associated sxs/prior Treatment) HPI Comments: Pt reports waking up with tiny insect bite 2 days ago; over time area around bite has become red and painful; no drainage  Patient is a 34 y.o. female presenting with rash. The history is provided by the patient.  Rash Location:  Torso Torso rash location:  Abd RLQ Quality: redness and swelling   Severity:  Moderate Onset quality:  Gradual Duration:  2 days Timing:  Constant Progression:  Worsening Chronicity:  New Context: insect bite/sting   Relieved by:  Nothing Worsened by:  Contact Ineffective treatments: rubbing alcohol and antibacterial ointment. Associated symptoms: no fever     Past Medical History  Diagnosis Date  . Asthma    Past Surgical History  Procedure Laterality Date  . Cesarean section    . Wrist cyst     No family history on file. History  Substance Use Topics  . Smoking status: Current Every Day Smoker -- 0.50 packs/day  . Smokeless tobacco: Not on file  . Alcohol Use: No   OB History   Grav Para Term Preterm Abortions TAB SAB Ect Mult Living                 Review of Systems  Constitutional: Negative for fever and chills.  Skin: Positive for color change and rash.    Allergies  Shellfish allergy and Codeine  Home Medications   Prior to Admission medications   Medication Sig Start Date End Date Taking? Authorizing Provider  Aspirin-Salicylamide-Caffeine (BC HEADACHE POWDER PO) Take 1 Package by mouth daily.    Historical Provider, MD  ibuprofen (ADVIL,MOTRIN) 400 MG tablet Take 400 mg by mouth every 6 (six) hours as needed. For pain    Historical Provider, MD  levonorgestrel-ethinyl estradiol (SRONYX) 0.1-20 MG-MCG tablet Take 1 tablet by mouth daily.     Historical Provider, MD  sulfamethoxazole-trimethoprim (SEPTRA DS) 800-160 MG per tablet Take 1 tablet by mouth every 12 (twelve) hours. 10/22/14   Cathlyn ParsonsAngela M Marieta Markov, NP   BP 128/87  Pulse 107  Temp(Src) 98.9 F (37.2 C) (Oral)  Resp 16  Ht 5\' 5"  (1.651 m)  Wt 148 lb (67.132 kg)  BMI 24.63 kg/m2  SpO2 99%  LMP 10/22/2014 Physical Exam  Constitutional: She appears well-developed and well-nourished. No distress.  Skin: Skin is warm and dry. There is erythema.     Erythematous area is very tender to palp    ED Course  Procedures (including critical care time) Labs Review Labs Reviewed - No data to display  Imaging Review No results found.   MDM   1. Infected insect bite of abdomen, initial encounter   rx bactrim DS 1 po BID #20    Cathlyn ParsonsAngela M Synda Bagent, NP 10/22/14 1946

## 2020-09-10 ENCOUNTER — Inpatient Hospital Stay (HOSPITAL_COMMUNITY)
Admission: AD | Admit: 2020-09-10 | Discharge: 2020-09-11 | Disposition: A | Discharge status: Home / Self Care | Payer: Medicaid Other | Attending: Obstetrics and Gynecology | Admitting: Obstetrics and Gynecology

## 2020-09-10 ENCOUNTER — Encounter (HOSPITAL_COMMUNITY): Payer: Self-pay | Admitting: Obstetrics and Gynecology

## 2020-09-10 ENCOUNTER — Other Ambulatory Visit: Payer: Self-pay

## 2020-09-10 DIAGNOSIS — O219 Vomiting of pregnancy, unspecified: Secondary | ICD-10-CM | POA: Insufficient documentation

## 2020-09-10 DIAGNOSIS — Z7982 Long term (current) use of aspirin: Secondary | ICD-10-CM | POA: Insufficient documentation

## 2020-09-10 DIAGNOSIS — Z885 Allergy status to narcotic agent status: Secondary | ICD-10-CM | POA: Diagnosis not present

## 2020-09-10 DIAGNOSIS — Z87891 Personal history of nicotine dependence: Secondary | ICD-10-CM | POA: Diagnosis not present

## 2020-09-10 DIAGNOSIS — O09521 Supervision of elderly multigravida, first trimester: Secondary | ICD-10-CM | POA: Insufficient documentation

## 2020-09-10 DIAGNOSIS — Z3A09 9 weeks gestation of pregnancy: Secondary | ICD-10-CM | POA: Diagnosis not present

## 2020-09-10 LAB — URINALYSIS, ROUTINE W REFLEX MICROSCOPIC
Bilirubin Urine: NEGATIVE
Glucose, UA: NEGATIVE mg/dL
Hgb urine dipstick: NEGATIVE
Ketones, ur: NEGATIVE mg/dL
Leukocytes,Ua: NEGATIVE
Nitrite: NEGATIVE
Protein, ur: NEGATIVE mg/dL
Specific Gravity, Urine: >1.03 — ABNORMAL HIGH (ref 1.005–1.030)
pH: 6 (ref 5.0–8.0)

## 2020-09-10 MED ORDER — DOXYLAMINE-PYRIDOXINE 10-10 MG PO TBEC: 2.0000 | DELAYED_RELEASE_TABLET | Freq: Every day | ORAL | 2 refills | Status: DC

## 2020-09-10 MED ORDER — LACTATED RINGERS IV BOLUS: 1000.0000 mL | Freq: Once | INTRAVENOUS | Status: DC

## 2020-09-10 MED ORDER — PROMETHAZINE HCL 25 MG/ML IJ SOLN
25.0000 mg | Freq: Once | INTRAMUSCULAR | Status: AC
  Administered 2020-09-10: 25 mg via INTRAVENOUS
  Filled 2020-09-10: qty 1

## 2020-09-10 MED ORDER — FAMOTIDINE IN NACL 20-0.9 MG/50ML-% IV SOLN
20.0000 mg | Freq: Once | INTRAVENOUS | Status: AC
  Administered 2020-09-10: 20 mg via INTRAVENOUS
  Filled 2020-09-10: qty 50

## 2020-09-10 MED ORDER — M.V.I. ADULT IV INJ
Freq: Once | INTRAVENOUS | Status: AC
  Filled 2020-09-10: qty 10

## 2020-09-10 MED ORDER — PROMETHAZINE HCL 12.5 MG PO TABS: 12.5000 mg | ORAL_TABLET | Freq: Four times a day (QID) | ORAL | 0 refills | Status: DC | PRN

## 2020-09-10 MED ORDER — LACTATED RINGERS IV BOLUS
1000.0000 mL | Freq: Once | INTRAVENOUS | Status: AC
  Administered 2020-09-10: 1000 mL via INTRAVENOUS

## 2020-09-10 NOTE — MAU Note (Signed)
Pt reports to MAU c/o NV. Pt reports she vomited 4+ times today. No bleeding LOF or pain.

## 2020-09-10 NOTE — MAU Provider Note (Signed)
History     CSN: [id number]  Arrival date and time: 09/10/20 1901   First Provider Initiated Contact with Patient 09/10/20 2039      No chief complaint on file.  Barbara Patel is a 40 y.o. F7P1025 at [redacted]w[redacted]d who receives care at [facility].  She presents today for Nausea and Vomiting.  Patient states that she has been have symptoms for the last 2 weeks and states that it is "severe."  Patient reports she has thrown up more than 4 times today.  She reports she ate chili at 2pm and was able to keep it down for about one hour.  She reports she tried to drink ginger ale around 7pm.  She then states she had some water this morning, but "didn't even want it because it taste weird."  She endorses morning sickness in previous pregnancies, but reports this is the worse. She reports her last incident of vomiting was at 6pm.    OB History    Gravida  3   Para  2   Term  1   Preterm  1   AB      Living  3     SAB      TAB      Ectopic      Multiple      Live Births  3           Past Medical History:  Diagnosis Date  . Asthma     Past Surgical History:  Procedure Laterality Date  . CESAREAN SECTION    . wrist cyst      History reviewed. No pertinent family history.  Social History   Tobacco Use  . Smoking status: Former Smoker    Packs/day: 0.50  . Smokeless tobacco: Never Used  Substance Use Topics  . Alcohol use: No  . Drug use: No    Allergies:  Allergies  Allergen Reactions  . Shellfish Allergy Anaphylaxis  . Codeine Itching, Nausea And Vomiting and Swelling    Medications Prior to Admission  Medication Sig Dispense Refill Last Dose  . Aspirin-Salicylamide-Caffeine (BC HEADACHE POWDER PO) Take 1 Package by mouth daily.     Marland Kitchen ibuprofen (ADVIL,MOTRIN) 400 MG tablet Take 400 mg by mouth every 6 (six) hours as needed. For pain     . levonorgestrel-ethinyl estradiol (SRONYX) 0.1-20 MG-MCG tablet Take 1 tablet by mouth daily.     Marland Kitchen  sulfamethoxazole-trimethoprim (SEPTRA DS) 800-160 MG per tablet Take 1 tablet by mouth every 12 (twelve) hours. 20 tablet 0     Review of Systems  Constitutional: Negative for chills and fever.  Respiratory: Negative for cough and shortness of breath.   Gastrointestinal: Positive for nausea and vomiting. Negative for abdominal pain, constipation and diarrhea.  Genitourinary: Negative for difficulty urinating, dysuria, pelvic pain, vaginal bleeding and vaginal discharge.  Musculoskeletal: Negative for back pain.  Neurological: Positive for dizziness and light-headedness. Negative for headaches.   Physical Exam   Blood pressure (!) 119/58, pulse 86, temperature 98.2 F (36.8 C), temperature source Oral, resp. rate 17, weight 72.5 kg.  Physical Exam Constitutional:      General: She is not in acute distress.    Appearance: Normal appearance.  HENT:     Head: Normocephalic and atraumatic.  Eyes:     Conjunctiva/sclera: Conjunctivae normal.  Cardiovascular:     Rate and Rhythm: Normal rate and regular rhythm.     Pulses: Normal pulses.     Heart sounds: Normal  heart sounds.  Abdominal:     General: Abdomen is flat.     Palpations: Abdomen is soft.  Musculoskeletal:        General: Normal range of motion.     Cervical back: Normal range of motion.  Skin:    General: Skin is warm and dry.  Neurological:     Mental Status: She is alert and oriented to person, place, and time.  Psychiatric:        Mood and Affect: Mood normal.        Behavior: Behavior normal.        Thought Content: Thought content normal.     MAU Course  Procedures Results for orders placed or performed during the hospital encounter of 09/10/20 (from the past 24 hour(s))  Urinalysis, Routine w reflex microscopic Urine, Clean Catch     Status: Abnormal   Collection Time: 09/10/20  8:40 PM  Result Value Ref Range   Color, Urine YELLOW YELLOW   APPearance CLEAR CLEAR   Specific Gravity, Urine >1.030 (H)  1.005 - 1.030   pH 6.0 5.0 - 8.0   Glucose, UA NEGATIVE NEGATIVE mg/dL   Hgb urine dipstick NEGATIVE NEGATIVE   Bilirubin Urine NEGATIVE NEGATIVE   Ketones, ur NEGATIVE NEGATIVE mg/dL   Protein, ur NEGATIVE NEGATIVE mg/dL   Nitrite NEGATIVE NEGATIVE   Leukocytes,Ua NEGATIVE NEGATIVE    MDM Start IV LR Bolus f/b MVI Antiemetic PPI Assessment and Plan  40 year old G3P1103 at 9.6 weeks Nausea Vomiting  -POC reviewed. -Exam performed. -Discussed management of nausea/vomiting in pregnancy with small frequent meals throughout the day, sips of fluids, and avoidance of highly fragrant foods. -Patient verbalizes understanding. -Will give LR f/b MVI -Phenergan ordered. -Pepcid ordered -Will monitor and reassess.   Cherre Robins 09/10/2020, 8:39 PM   Reassessment (10:36 PM)  -Patient reports improvement in nausea. -Discussed home medication regime. -Will send diclegis for nightly use and phenergan for prn usage. -Patient without questions or concerns. -Will await for completion of MVI then plan for discharge. -Patient verbalizes understanding. -Rx sent to pharmacy on file.   Reassessment (11:37 PM) -Infusion complete -Encouraged to call or return to MAU if symptoms worsen or with the onset of new symptoms. -Discharged to home in improved condition.  Cherre Robins MSN, CNM Advanced Practice Provider, [facility] [employer]

## 2020-09-10 NOTE — Discharge Instructions (Signed)
Morning Sickness ° °Morning sickness is when you feel sick to your stomach (nauseous) during pregnancy. You may feel sick to your stomach and throw up (vomit). You may feel sick in the morning, but you can feel this way at any time of day. Some women feel very sick to their stomach and cannot stop throwing up (hyperemesis gravidarum). °Follow these instructions at home: °Medicines °· Take over-the-counter and prescription medicines only as told by your doctor. Do not take any medicines until you talk with your doctor about them first. °· Taking multivitamins before getting pregnant can stop or lessen the harshness of morning sickness. °Eating and drinking °· Eat dry toast or crackers before getting out of bed. °· Eat 5 or 6 small meals a day. °· Eat dry and bland foods like rice and baked potatoes. °· Do not eat greasy, fatty, or spicy foods. °· Have someone cook for you if the smell of food causes you to feel sick or throw up. °· If you feel sick to your stomach after taking prenatal vitamins, take them at night or with a snack. °· Eat protein when you need a snack. Nuts, yogurt, and cheese are good choices. °· Drink fluids throughout the day. °· Try ginger ale made with real ginger, ginger tea made from fresh grated ginger, or ginger candies. °General instructions °· Do not use any products that have nicotine or tobacco in them, such as cigarettes and e-cigarettes. If you need help quitting, ask your doctor. °· Use an air purifier to keep the air in your house free of smells. °· Get lots of fresh air. °· Try to avoid smells that make you feel sick. °· Try: °? Wearing a bracelet that is used for seasickness (acupressure wristband). °? Going to a doctor who puts thin needles into certain body points (acupuncture) to improve how you feel. °Contact a doctor if: °· You need medicine to feel better. °· You feel dizzy or light-headed. °· You are losing weight. °Get help right away if: °· You feel very sick to your  stomach and cannot stop throwing up. °· You pass out (faint). °· You have very bad pain in your belly. °Summary °· Morning sickness is when you feel sick to your stomach (nauseous) during pregnancy. °· You may feel sick in the morning, but you can feel this way at any time of day. °· Making some changes to what you eat may help your symptoms go away. °This information is not intended to replace advice given to you by your health care provider. Make sure you discuss any questions you have with your health care provider. °Document Revised: 11/29/2017 Document Reviewed: 01/17/2017 °Elsevier Patient Education © 2020 [employer] ° °

## 2021-03-30 LAB — OB RESULTS CONSOLE GBS: GBS: NEGATIVE

## 2021-04-05 ENCOUNTER — Encounter (HOSPITAL_COMMUNITY): Payer: Self-pay | Admitting: *Deleted

## 2021-04-05 ENCOUNTER — Other Ambulatory Visit: Payer: Self-pay | Admitting: Obstetrics and Gynecology

## 2021-04-05 ENCOUNTER — Other Ambulatory Visit (HOSPITAL_COMMUNITY)
Admission: RE | Admit: 2021-04-05 | Discharge: 2021-04-05 | Disposition: A | Discharge status: Home / Self Care | Payer: Medicaid Other | Source: Ambulatory Visit | Attending: Obstetrics and Gynecology | Admitting: Obstetrics and Gynecology

## 2021-04-05 ENCOUNTER — Telehealth (HOSPITAL_COMMUNITY): Payer: Self-pay | Admitting: *Deleted

## 2021-04-05 DIAGNOSIS — Z20822 Contact with and (suspected) exposure to covid-19: Secondary | ICD-10-CM | POA: Insufficient documentation

## 2021-04-05 DIAGNOSIS — Z01812 Encounter for preprocedural laboratory examination: Secondary | ICD-10-CM | POA: Insufficient documentation

## 2021-04-05 LAB — SARS CORONAVIRUS 2 (TAT 6-24 HRS): SARS Coronavirus 2: NEGATIVE

## 2021-04-05 NOTE — Telephone Encounter (Signed)
Preadmission screen  

## 2021-04-06 ENCOUNTER — Inpatient Hospital Stay (HOSPITAL_COMMUNITY): Payer: Medicaid Other | Admitting: Anesthesiology

## 2021-04-06 ENCOUNTER — Inpatient Hospital Stay (HOSPITAL_COMMUNITY)
Admission: AD | Admit: 2021-04-06 | Discharge: 2021-04-08 | DRG: 788 | Disposition: A | Discharge status: Home / Self Care | Payer: Medicaid Other | Attending: Obstetrics and Gynecology | Admitting: Obstetrics and Gynecology

## 2021-04-06 ENCOUNTER — Other Ambulatory Visit: Payer: Self-pay

## 2021-04-06 ENCOUNTER — Encounter (HOSPITAL_COMMUNITY)
Admission: AD | Disposition: A | Discharge status: Home / Self Care | Payer: Self-pay | Source: Home / Self Care | Attending: Obstetrics and Gynecology

## 2021-04-06 ENCOUNTER — Encounter (HOSPITAL_COMMUNITY): Payer: Self-pay | Admitting: Obstetrics and Gynecology

## 2021-04-06 ENCOUNTER — Inpatient Hospital Stay (HOSPITAL_COMMUNITY): Payer: Medicaid Other

## 2021-04-06 DIAGNOSIS — Z20822 Contact with and (suspected) exposure to covid-19: Secondary | ICD-10-CM | POA: Diagnosis present

## 2021-04-06 DIAGNOSIS — O34211 Maternal care for low transverse scar from previous cesarean delivery: Secondary | ICD-10-CM | POA: Diagnosis present

## 2021-04-06 DIAGNOSIS — Z3A39 39 weeks gestation of pregnancy: Secondary | ICD-10-CM | POA: Diagnosis not present

## 2021-04-06 DIAGNOSIS — O9902 Anemia complicating childbirth: Secondary | ICD-10-CM | POA: Diagnosis present

## 2021-04-06 DIAGNOSIS — O26893 Other specified pregnancy related conditions, third trimester: Secondary | ICD-10-CM | POA: Diagnosis present

## 2021-04-06 DIAGNOSIS — Z87891 Personal history of nicotine dependence: Secondary | ICD-10-CM | POA: Diagnosis not present

## 2021-04-06 HISTORY — DX: Anxiety disorder, unspecified: F41.9

## 2021-04-06 LAB — CBC
HCT: 36.9 % (ref 36.0–46.0)
Hemoglobin: 12.8 g/dL (ref 12.0–15.0)
MCH: 31.9 pg (ref 26.0–34.0)
MCHC: 34.7 g/dL (ref 30.0–36.0)
MCV: 92 fL (ref 80.0–100.0)
Platelets: 178 10*3/uL (ref 150–400)
RBC: 4.01 MIL/uL (ref 3.87–5.11)
RDW: 13.1 % (ref 11.5–15.5)
WBC: 8.3 10*3/uL (ref 4.0–10.5)
nRBC: 0 % (ref 0.0–0.2)

## 2021-04-06 LAB — TYPE AND SCREEN
ABO/RH(D): A POS
Antibody Screen: NEGATIVE

## 2021-04-06 LAB — RPR: RPR Ser Ql: NONREACTIVE

## 2021-04-06 SURGERY — Surgical Case
Anesthesia: Epidural

## 2021-04-06 MED ORDER — ACETAMINOPHEN 500 MG PO TABS
1000.0000 mg | ORAL_TABLET | Freq: Four times a day (QID) | ORAL | Status: DC
  Administered 2021-04-06 – 2021-04-08 (×7): 1000 mg via ORAL
  Filled 2021-04-06 (×8): qty 2

## 2021-04-06 MED ORDER — SIMETHICONE 80 MG PO CHEW
80.0000 mg | CHEWABLE_TABLET | ORAL | Status: DC | PRN
  Administered 2021-04-08: 80 mg via ORAL

## 2021-04-06 MED ORDER — EPHEDRINE 5 MG/ML INJ: 10.0000 mg | INTRAVENOUS | Status: DC | PRN

## 2021-04-06 MED ORDER — ACETAMINOPHEN 160 MG/5ML PO SOLN: 1000.0000 mg | Freq: Once | ORAL | Status: DC

## 2021-04-06 MED ORDER — CITALOPRAM HYDROBROMIDE 20 MG PO TABS
20.0000 mg | ORAL_TABLET | Freq: Every day | ORAL | Status: DC
  Administered 2021-04-06 – 2021-04-07 (×2): 20 mg via ORAL
  Filled 2021-04-06 (×2): qty 1

## 2021-04-06 MED ORDER — DIBUCAINE (PERIANAL) 1 % EX OINT: 1.0000 "application " | TOPICAL_OINTMENT | CUTANEOUS | Status: DC | PRN

## 2021-04-06 MED ORDER — ZOLPIDEM TARTRATE 5 MG PO TABS: 5.0000 mg | ORAL_TABLET | Freq: Every evening | ORAL | Status: DC | PRN

## 2021-04-06 MED ORDER — KETOROLAC TROMETHAMINE 30 MG/ML IJ SOLN: 30.0000 mg | Freq: Four times a day (QID) | INTRAMUSCULAR | Status: DC | PRN

## 2021-04-06 MED ORDER — DIPHENHYDRAMINE HCL 50 MG/ML IJ SOLN: 12.5000 mg | Freq: Four times a day (QID) | INTRAMUSCULAR | Status: DC | PRN

## 2021-04-06 MED ORDER — LACTATED RINGERS IV SOLN: INTRAVENOUS | Status: DC | PRN

## 2021-04-06 MED ORDER — NALBUPHINE HCL 10 MG/ML IJ SOLN
5.0000 mg | Freq: Once | INTRAMUSCULAR | Status: DC | PRN
Start: 2021-04-06 — End: 2021-04-08

## 2021-04-06 MED ORDER — LACTATED RINGERS IV SOLN: 500.0000 mL | Freq: Once | INTRAVENOUS | Status: DC

## 2021-04-06 MED ORDER — MEASLES, MUMPS & RUBELLA VAC IJ SOLR: 0.5000 mL | Freq: Once | INTRAMUSCULAR | Status: DC

## 2021-04-06 MED ORDER — OXYTOCIN-SODIUM CHLORIDE 30-0.9 UT/500ML-% IV SOLN
1.0000 m[IU]/min | INTRAVENOUS | Status: DC
  Filled 2021-04-06: qty 500

## 2021-04-06 MED ORDER — LIDOCAINE-EPINEPHRINE (PF) 2 %-1:200000 IJ SOLN: INTRAMUSCULAR | Status: DC | PRN

## 2021-04-06 MED ORDER — ONDANSETRON HCL 4 MG/2ML IJ SOLN
INTRAMUSCULAR | Status: DC | PRN
  Administered 2021-04-06: 4 mg via INTRAVENOUS

## 2021-04-06 MED ORDER — PHENYLEPHRINE HCL (PRESSORS) 10 MG/ML IV SOLN
INTRAVENOUS | Status: DC | PRN
  Administered 2021-04-06 (×5): 80 ug via INTRAVENOUS

## 2021-04-06 MED ORDER — PHENYLEPHRINE 40 MCG/ML (10ML) SYRINGE FOR IV PUSH (FOR BLOOD PRESSURE SUPPORT): 80.0000 ug | PREFILLED_SYRINGE | INTRAVENOUS | Status: DC | PRN

## 2021-04-06 MED ORDER — ACETAMINOPHEN 500 MG PO TABS: 1000.0000 mg | ORAL_TABLET | Freq: Four times a day (QID) | ORAL | Status: DC

## 2021-04-06 MED ORDER — OXYTOCIN-SODIUM CHLORIDE 30-0.9 UT/500ML-% IV SOLN
INTRAVENOUS | Status: DC | PRN
  Administered 2021-04-06: 450 mL via INTRAVENOUS

## 2021-04-06 MED ORDER — LACTATED RINGERS IV SOLN: INTRAVENOUS | Status: DC

## 2021-04-06 MED ORDER — FENTANYL CITRATE (PF) 100 MCG/2ML IJ SOLN
INTRAMUSCULAR | Status: AC
  Filled 2021-04-06: qty 2

## 2021-04-06 MED ORDER — WITCH HAZEL-GLYCERIN EX PADS: 1.0000 "application " | MEDICATED_PAD | CUTANEOUS | Status: DC | PRN

## 2021-04-06 MED ORDER — FENTANYL CITRATE (PF) 100 MCG/2ML IJ SOLN
INTRAMUSCULAR | Status: DC | PRN
  Administered 2021-04-06: 50 ug via EPIDURAL
  Administered 2021-04-06: 100 ug via EPIDURAL
  Administered 2021-04-06: 50 ug via EPIDURAL

## 2021-04-06 MED ORDER — BUTORPHANOL TARTRATE 1 MG/ML IJ SOLN
1.0000 mg | INTRAMUSCULAR | Status: DC | PRN
  Administered 2021-04-06 (×2): 1 mg via INTRAVENOUS
  Filled 2021-04-06 (×2): qty 1

## 2021-04-06 MED ORDER — COCONUT OIL OIL: 1.0000 "application " | TOPICAL_OIL | Status: DC | PRN

## 2021-04-06 MED ORDER — OXYTOCIN-SODIUM CHLORIDE 30-0.9 UT/500ML-% IV SOLN: 2.5000 [IU]/h | INTRAVENOUS | Status: DC

## 2021-04-06 MED ORDER — KETOROLAC TROMETHAMINE 30 MG/ML IJ SOLN
INTRAMUSCULAR | Status: AC
  Filled 2021-04-06: qty 1

## 2021-04-06 MED ORDER — MENTHOL 3 MG MT LOZG: 1.0000 | LOZENGE | OROMUCOSAL | Status: DC | PRN

## 2021-04-06 MED ORDER — DEXAMETHASONE SODIUM PHOSPHATE 4 MG/ML IJ SOLN
INTRAMUSCULAR | Status: DC | PRN
  Administered 2021-04-06: 8 mg via INTRAVENOUS

## 2021-04-06 MED ORDER — OXYCODONE-ACETAMINOPHEN 5-325 MG PO TABS
1.0000 | ORAL_TABLET | ORAL | Status: DC | PRN
Start: 2021-04-06 — End: 2021-04-06

## 2021-04-06 MED ORDER — CEFAZOLIN SODIUM-DEXTROSE 2-3 GM-%(50ML) IV SOLR
INTRAVENOUS | Status: DC | PRN
  Administered 2021-04-06: 2 g via INTRAVENOUS

## 2021-04-06 MED ORDER — NALBUPHINE HCL 10 MG/ML IJ SOLN: 5.0000 mg | INTRAMUSCULAR | Status: DC | PRN

## 2021-04-06 MED ORDER — KETOROLAC TROMETHAMINE 30 MG/ML IJ SOLN
30.0000 mg | Freq: Four times a day (QID) | INTRAMUSCULAR | Status: AC
  Administered 2021-04-06 – 2021-04-07 (×3): 30 mg via INTRAVENOUS
  Filled 2021-04-06 (×3): qty 1

## 2021-04-06 MED ORDER — IBUPROFEN 800 MG PO TABS
800.0000 mg | ORAL_TABLET | Freq: Three times a day (TID) | ORAL | Status: DC
  Administered 2021-04-07 – 2021-04-08 (×3): 800 mg via ORAL
  Filled 2021-04-06 (×3): qty 1

## 2021-04-06 MED ORDER — OXYCODONE HCL 5 MG PO TABS: 5.0000 mg | ORAL_TABLET | ORAL | Status: DC | PRN

## 2021-04-06 MED ORDER — NALOXONE HCL 4 MG/10ML IJ SOLN
1.0000 ug/kg/h | INTRAVENOUS | Status: DC | PRN
  Filled 2021-04-06: qty 5

## 2021-04-06 MED ORDER — MORPHINE SULFATE (PF) 0.5 MG/ML IJ SOLN
INTRAMUSCULAR | Status: DC | PRN
  Administered 2021-04-06: 3 mg via EPIDURAL

## 2021-04-06 MED ORDER — SOD CITRATE-CITRIC ACID 500-334 MG/5ML PO SOLN: 30.0000 mL | ORAL | Status: DC | PRN

## 2021-04-06 MED ORDER — METHYLERGONOVINE MALEATE 0.2 MG/ML IJ SOLN: 0.2000 mg | INTRAMUSCULAR | Status: DC | PRN

## 2021-04-06 MED ORDER — FENTANYL CITRATE (PF) 100 MCG/2ML IJ SOLN: 25.0000 ug | INTRAMUSCULAR | Status: DC | PRN

## 2021-04-06 MED ORDER — DIPHENHYDRAMINE HCL 25 MG PO CAPS: 25.0000 mg | ORAL_CAPSULE | Freq: Four times a day (QID) | ORAL | Status: DC | PRN

## 2021-04-06 MED ORDER — DEXMEDETOMIDINE (PRECEDEX) IN NS 20 MCG/5ML (4 MCG/ML) IV SYRINGE
PREFILLED_SYRINGE | INTRAVENOUS | Status: DC | PRN
  Administered 2021-04-06: 8 ug via INTRAVENOUS

## 2021-04-06 MED ORDER — PROMETHAZINE HCL 25 MG/ML IJ SOLN: 6.2500 mg | INTRAMUSCULAR | Status: DC | PRN

## 2021-04-06 MED ORDER — LACTATED RINGERS IV SOLN: 500.0000 mL | INTRAVENOUS | Status: DC | PRN

## 2021-04-06 MED ORDER — OXYTOCIN BOLUS FROM INFUSION: 333.0000 mL | Freq: Once | INTRAVENOUS | Status: DC

## 2021-04-06 MED ORDER — OXYCODONE-ACETAMINOPHEN 5-325 MG PO TABS: 2.0000 | ORAL_TABLET | ORAL | Status: DC | PRN

## 2021-04-06 MED ORDER — PHENYLEPHRINE 40 MCG/ML (10ML) SYRINGE FOR IV PUSH (FOR BLOOD PRESSURE SUPPORT)
80.0000 ug | PREFILLED_SYRINGE | INTRAVENOUS | Status: DC | PRN
  Filled 2021-04-06: qty 10

## 2021-04-06 MED ORDER — TERBUTALINE SULFATE 1 MG/ML IJ SOLN: 0.2500 mg | Freq: Once | INTRAMUSCULAR | Status: DC | PRN

## 2021-04-06 MED ORDER — MORPHINE SULFATE (PF) 0.5 MG/ML IJ SOLN
INTRAMUSCULAR | Status: AC
  Filled 2021-04-06: qty 10

## 2021-04-06 MED ORDER — ACETAMINOPHEN 325 MG PO TABS: 650.0000 mg | ORAL_TABLET | ORAL | Status: DC | PRN

## 2021-04-06 MED ORDER — NALOXONE HCL 0.4 MG/ML IJ SOLN: 0.4000 mg | INTRAMUSCULAR | Status: DC | PRN

## 2021-04-06 MED ORDER — SOD CITRATE-CITRIC ACID 500-334 MG/5ML PO SOLN
ORAL | Status: AC
  Filled 2021-04-06: qty 15

## 2021-04-06 MED ORDER — DIPHENHYDRAMINE HCL 50 MG/ML IJ SOLN
12.5000 mg | INTRAMUSCULAR | Status: DC | PRN
Start: 2021-04-06 — End: 2021-04-06

## 2021-04-06 MED ORDER — LIDOCAINE HCL (PF) 1 % IJ SOLN: 30.0000 mL | INTRAMUSCULAR | Status: DC | PRN

## 2021-04-06 MED ORDER — STERILE WATER FOR IRRIGATION IR SOLN
Status: DC | PRN
  Administered 2021-04-06: 1

## 2021-04-06 MED ORDER — PRENATAL MULTIVITAMIN CH
1.0000 | ORAL_TABLET | Freq: Every day | ORAL | Status: DC
  Administered 2021-04-07: 1 via ORAL
  Filled 2021-04-06 (×2): qty 1

## 2021-04-06 MED ORDER — ONDANSETRON HCL 4 MG/2ML IJ SOLN
INTRAMUSCULAR | Status: AC
  Filled 2021-04-06: qty 2

## 2021-04-06 MED ORDER — ACETAMINOPHEN 500 MG PO TABS: 1000.0000 mg | ORAL_TABLET | Freq: Once | ORAL | Status: DC

## 2021-04-06 MED ORDER — SODIUM CHLORIDE 0.9 % IR SOLN
Status: DC | PRN
  Administered 2021-04-06: 1

## 2021-04-06 MED ORDER — OXYTOCIN-SODIUM CHLORIDE 30-0.9 UT/500ML-% IV SOLN
1.0000 m[IU]/min | INTRAVENOUS | Status: DC
  Administered 2021-04-06: 2 m[IU]/min via INTRAVENOUS

## 2021-04-06 MED ORDER — ONDANSETRON HCL 4 MG/2ML IJ SOLN: 4.0000 mg | Freq: Four times a day (QID) | INTRAMUSCULAR | Status: DC | PRN

## 2021-04-06 MED ORDER — DEXAMETHASONE SODIUM PHOSPHATE 4 MG/ML IJ SOLN
INTRAMUSCULAR | Status: AC
  Filled 2021-04-06: qty 1

## 2021-04-06 MED ORDER — SENNOSIDES-DOCUSATE SODIUM 8.6-50 MG PO TABS
2.0000 | ORAL_TABLET | ORAL | Status: DC
  Administered 2021-04-07 – 2021-04-08 (×2): 2 via ORAL
  Filled 2021-04-06 (×2): qty 2

## 2021-04-06 MED ORDER — TETANUS-DIPHTH-ACELL PERTUSSIS 5-2.5-18.5 LF-MCG/0.5 IM SUSY: 0.5000 mL | PREFILLED_SYRINGE | Freq: Once | INTRAMUSCULAR | Status: DC

## 2021-04-06 MED ORDER — KETOROLAC TROMETHAMINE 30 MG/ML IJ SOLN
30.0000 mg | Freq: Four times a day (QID) | INTRAMUSCULAR | Status: DC | PRN
  Administered 2021-04-06: 30 mg via INTRAMUSCULAR

## 2021-04-06 MED ORDER — MAGNESIUM HYDROXIDE 400 MG/5ML PO SUSP: 30.0000 mL | ORAL | Status: DC | PRN

## 2021-04-06 MED ORDER — PHENYLEPHRINE 40 MCG/ML (10ML) SYRINGE FOR IV PUSH (FOR BLOOD PRESSURE SUPPORT)
PREFILLED_SYRINGE | INTRAVENOUS | Status: AC
  Filled 2021-04-06: qty 10

## 2021-04-06 MED ORDER — FENTANYL-BUPIVACAINE-NACL 0.5-0.125-0.9 MG/250ML-% EP SOLN
12.0000 mL/h | EPIDURAL | Status: DC | PRN
  Administered 2021-04-06: 12 mL/h via EPIDURAL
  Filled 2021-04-06: qty 250

## 2021-04-06 MED ORDER — METHYLERGONOVINE MALEATE 0.2 MG PO TABS: 0.2000 mg | ORAL_TABLET | ORAL | Status: DC | PRN

## 2021-04-06 MED ORDER — OXYTOCIN-SODIUM CHLORIDE 30-0.9 UT/500ML-% IV SOLN: 2.5000 [IU]/h | INTRAVENOUS | Status: AC

## 2021-04-06 MED ORDER — OXYTOCIN-SODIUM CHLORIDE 30-0.9 UT/500ML-% IV SOLN
INTRAVENOUS | Status: AC
  Filled 2021-04-06: qty 500

## 2021-04-06 MED ORDER — LIDOCAINE HCL (PF) 1 % IJ SOLN
INTRAMUSCULAR | Status: DC | PRN
  Administered 2021-04-06 (×2): 4 mL via EPIDURAL

## 2021-04-06 MED ORDER — SODIUM CHLORIDE 0.9% FLUSH: 3.0000 mL | INTRAVENOUS | Status: DC | PRN

## 2021-04-06 MED ORDER — KETOROLAC TROMETHAMINE 30 MG/ML IJ SOLN: 30.0000 mg | Freq: Once | INTRAMUSCULAR | Status: DC

## 2021-04-06 MED ORDER — ONDANSETRON HCL 4 MG/2ML IJ SOLN: 4.0000 mg | Freq: Three times a day (TID) | INTRAMUSCULAR | Status: DC | PRN

## 2021-04-06 MED ORDER — CHLOROPROCAINE HCL (PF) 3 % IJ SOLN
INTRAMUSCULAR | Status: DC | PRN
  Administered 2021-04-06 (×2): 10 mL

## 2021-04-06 SURGICAL SUPPLY — 33 items
APL SKNCLS STERI-STRIP NONHPOA (GAUZE/BANDAGES/DRESSINGS) ×1
BENZOIN TINCTURE PRP APPL 2/3 (GAUZE/BANDAGES/DRESSINGS) ×1 IMPLANT
CHLORAPREP W/TINT 26ML (MISCELLANEOUS) ×2 IMPLANT
CLAMP CORD UMBIL (MISCELLANEOUS) IMPLANT
CLOTH BEACON ORANGE TIMEOUT ST (SAFETY) ×2 IMPLANT
DRSG OPSITE POSTOP 4X10 (GAUZE/BANDAGES/DRESSINGS) ×2 IMPLANT
ELECT REM PT RETURN 9FT ADLT (ELECTROSURGICAL) ×2
ELECTRODE REM PT RTRN 9FT ADLT (ELECTROSURGICAL) ×1 IMPLANT
EXTRACTOR VACUUM KIWI (MISCELLANEOUS) IMPLANT
EXTRACTOR VACUUM M CUP 4 TUBE (SUCTIONS) IMPLANT
GLOVE BIOGEL PI IND STRL 7.0 (GLOVE) ×1 IMPLANT
GLOVE BIOGEL PI INDICATOR 7.0 (GLOVE) ×1
GLOVE ORTHO TXT STRL SZ7.5 (GLOVE) ×2 IMPLANT
GOWN STRL REUS W/TWL LRG LVL3 (GOWN DISPOSABLE) ×4 IMPLANT
KIT ABG SYR 3ML LUER SLIP (SYRINGE) IMPLANT
NDL HYPO 25X5/8 SAFETYGLIDE (NEEDLE) ×1 IMPLANT
NEEDLE HYPO 25X5/8 SAFETYGLIDE (NEEDLE) ×2 IMPLANT
NS IRRIG 1000ML POUR BTL (IV SOLUTION) ×2 IMPLANT
PACK C SECTION WH (CUSTOM PROCEDURE TRAY) ×2 IMPLANT
PAD OB MATERNITY 4.3X12.25 (PERSONAL CARE ITEMS) ×2 IMPLANT
PENCIL SMOKE EVAC W/HOLSTER (ELECTROSURGICAL) ×2 IMPLANT
RTRCTR C-SECT PINK 25CM LRG (MISCELLANEOUS) ×2 IMPLANT
STRIP CLOSURE SKIN 1/2X4 (GAUZE/BANDAGES/DRESSINGS) ×2 IMPLANT
SUT CHROMIC 1 CTX 36 (SUTURE) ×4 IMPLANT
SUT PLAIN 0 NONE (SUTURE) IMPLANT
SUT PLAIN 2 0 XLH (SUTURE) IMPLANT
SUT VIC AB 0 CT1 27 (SUTURE) ×4
SUT VIC AB 0 CT1 27XBRD ANBCTR (SUTURE) ×2 IMPLANT
SUT VIC AB 2-0 CT1 (SUTURE) ×2 IMPLANT
SUT VIC AB 4-0 KS 27 (SUTURE) IMPLANT
TOWEL OR 17X24 6PK STRL BLUE (TOWEL DISPOSABLE) ×2 IMPLANT
TRAY FOLEY W/BAG SLVR 14FR LF (SET/KITS/TRAYS/PACK) ×2 IMPLANT
WATER STERILE IRR 1000ML POUR (IV SOLUTION) ×2 IMPLANT

## 2021-04-06 NOTE — Progress Notes (Signed)
Just received epidural, getting more comfortable Afeb, VSS FHT- 120s, mod variability, some early and variable decels, Cat II, ctx q 2-3 min VE-5-6/80/0, vtx, AROM mod meconium  Getting into active labor.  Will turn pitocin down from 10 mu/min to 4 mu/min since already with ctx q 2-3 min and AROM done, monitor progress, anticipate VBAC

## 2021-04-06 NOTE — Op Note (Signed)
Preoperative diagnosis: Intrauterine pregnancy at 39 weeks, AMA, previous c-section, prolonged FHR decelerations Postoperative diagnosis: Same Procedure: Repeat low transverse cesarean section without extensions Surgeon: Lavina Hamman M.D. Anesthesia: Epidural Findings: Patient had normal gravid anatomy and delivered a viable female infant with Apgars and weight pending Estimated blood loss: 670 cc Specimens: Placenta sent to labor and delivery, arterial cord pH 7.23 Complications: None  Procedure in detail: Around 1436, FHR began having prolonged decels.  Maintained moderate variability.  Pitocin turned off, FHR improved with position change, but had trouble maintaining previous baseline of 110-120.  IUPC and FSE placed.  FHR with some variable decels, amnioinfusion performed.  FHR continued having prolonged decels so stat c-section called.  The patient was taken to the operating room and placed in the dorsosupine position with left tilt. Her previously placed epidural was being dosed for a stat procedure.  Abdomen was then rapidly prepped with betadine and draped in the usual sterile fashion, a foley catheter had previously been inserted. The level of her anesthesia was found to be adequate. Abdomen was entered via a standard Pfannenstiel incision. Some bladder flap adhesions were taken down sharply.  The Alexis disposable self-retaining retractor was placed and good visualization was achieved. A 4 cm transverse incision was then made in the lower uterine segment pushing the bladder inferior. Once the uterine cavity was entered the incision was extended digitally, light meconium amniotic fluid. The fetal vertex was grasped and delivered through the incision atraumatically, loose nuchal cord x 1 reduced. Mouth and nares were suctioned. The remainder of the infant then delivered atraumatically. Cord was doubly clamped and cut after one minute and the infant handed to the awaiting pediatric team. Cord  blood and arterial cord pH were obtained. The placenta delivered spontaneously. Uterus was wiped dry with clean lap pad and all clots and debris were removed. Uterine incision was inspected and found to be free of extensions. Uterine incision was closed in 1 layer with running locking #1 Chromic. Tubes and ovaries were inspected and found to be normal. Uterine incision was inspected and found to be hemostatic. Bleeding from serosal edges was controlled with electrocautery. The Alexis retractor was removed. Subfascial space was irrigated and made hemostatic with electrocautery. Peritoneum was closed with 2-0 Vicryl.  Fascia was closed in running fashion starting at both ends and meeting in the middle with 0 Vicryl. Subcutaneous tissue was then irrigated and made hemostatic with electrocautery, then closed with running 2-0 plain gut. Skin was closed with running 4-0 Vicryl subcuticular suture followed by steri-strips and a sterile dressing. Patient tolerated the procedure well and was taken to the recovery in stable condition. Counts were correct x2, she received Ancef 2 g IV at the beginning of the procedure and she had PAS hose on throughout the procedure.

## 2021-04-06 NOTE — Lactation Note (Signed)
This note was copied from a baby's chart. Lactation Consultation Note  Patient Name: Barbara Patel PZWCH'E Date: 04/06/2021 Reason for consult: Initial assessment;Term Age:41 hours  Initial visit to 5 hours old infant of a P3 mother. Infant is cueing in basinet upon arrival. Mother states infant breastfed well during last feeding session.  Talked to mother about hand expression. Mother does not seem interested. Mother has her personal pump, set it up and started using it in order to supplement infant with own EBM.  LC checked infant and noticed stool and changed her diaper.   Reinforced breastfeeding on demand or 8-12 times in 24h period. Monitor voids and stools as signs good intake. Encouraged maternal rest, hydration and food intake.  Contact LC as needed for feeds/support/concerns/questions.  All questions answered at this time. Provided Lactation services brochure.   Maternal Data Does the patient have breastfeeding experience prior to this delivery?: Yes  Feeding Mother's Current Feeding Choice: Breast Milk  Interventions Interventions: Breast feeding basics reviewed;Skin to skin;Education;Expressed milk  Discharge Pump: Personal WIC Program: No  Consult Status Consult Status: Follow-up Date: 04/07/21 Follow-up type: In-patient    Nayan Proch A Higuera Ancidey 04/06/2021, 9:25 PM

## 2021-04-06 NOTE — Transfer of Care (Signed)
Immediate Anesthesia Transfer of Care Note  Patient: Barbara Patel  Procedure(s) Performed: CESAREAN SECTION (N/A )  Patient Location: PACU  Anesthesia Type:Epidural  Level of Consciousness: awake, alert  and oriented  Airway & Oxygen Therapy: Patient Spontanous Breathing  Post-op Assessment: Report given to RN and Post -op Vital signs reviewed and stable  Post vital signs: Reviewed and stable  Last Vitals:  Vitals Value Taken Time  BP 110/57 04/06/21 1636  Temp    Pulse 65 04/06/21 1643  Resp 10 04/06/21 1643  SpO2 96 % 04/06/21 1643  Vitals shown include unvalidated device data. Patient calm and comfortable. Last Pain:  Vitals:   04/06/21 1304  TempSrc:   PainSc: 3          Complications: No complications documented.

## 2021-04-06 NOTE — Anesthesia Preprocedure Evaluation (Addendum)
Anesthesia Evaluation  Patient identified by MRN, date of birth, ID band Patient awake    Reviewed: Allergy & Precautions, NPO status , Patient's Chart, lab work & pertinent test results  History of Anesthesia Complications Negative for: history of anesthetic complications  Airway Mallampati: II  TM Distance: >3 FB Neck ROM: Full    Dental no notable dental hx.    Pulmonary asthma , former smoker,    Pulmonary exam normal        Cardiovascular negative cardio ROS Normal cardiovascular exam     Neuro/Psych Anxiety negative neurological ROS     GI/Hepatic negative GI ROS, Neg liver ROS,   Endo/Other  negative endocrine ROS  Renal/GU negative Renal ROS  negative genitourinary   Musculoskeletal negative musculoskeletal ROS (+)   Abdominal   Peds  Hematology negative hematology ROS (+)   Anesthesia Other Findings Day of surgery medications reviewed with patient.  Reproductive/Obstetrics (+) Pregnancy (Hx of C/S x1)                           Anesthesia Physical Anesthesia Plan  ASA: III and emergent  Anesthesia Plan: Epidural   Post-op Pain Management:    Induction:   PONV Risk Score and Plan: 3 and Treatment may vary due to age or medical condition, Ondansetron and Dexamethasone  Airway Management Planned: Natural Airway  Additional Equipment:   Intra-op Plan:   Post-operative Plan:   Informed Consent: I have reviewed the patients History and Physical, chart, labs and discussed the procedure including the risks, benefits and alternatives for the proposed anesthesia with the patient or authorized representative who has indicated his/her understanding and acceptance.       Plan Discussed with: CRNA  Anesthesia Plan Comments: (Epidural to be used for Code Cesarean (NRFHT). Stephannie Peters, MD)      Anesthesia Quick Evaluation

## 2021-04-06 NOTE — H&P (Signed)
Barbara Patel is a 41 y.o. female, G4 P61, EGA [redacted] weeks with EDC 4-10 presenting for elective induction.  Previous SVD, then LTCS at 35 weeks for twins, desires VBAC.  PNC also complicated by anemia treated with PO iron, AMA with low risk NIPT.  OB History    Gravida  3   Para  2   Term  1   Preterm  1   AB      Living  3     SAB      IAB      Ectopic      Multiple  1   Live Births  3          Past Medical History:  Diagnosis Date  . Anxiety   . Asthma    Past Surgical History:  Procedure Laterality Date  . CESAREAN SECTION    . wrist cyst     Family History: family history is not on file. Social History:  reports that she has quit smoking. She smoked 0.50 packs per day. She has never used smokeless tobacco. She reports that she does not drink alcohol and does not use drugs.     Maternal Diabetes: No Genetic Screening: Normal Maternal Ultrasounds/Referrals: Normal Fetal Ultrasounds or other Referrals:  None Maternal Substance Abuse:  No Significant Maternal Medications:  None Significant Maternal Lab Results:  Group B Strep negative Other Comments:  None  Review of Systems  Respiratory: Negative.   Cardiovascular: Negative.    Maternal Medical History:  Contractions: Perceived severity is mild.    Fetal activity: Perceived fetal activity is normal.    Prenatal Complications - Diabetes: none.      There were no vitals taken for this visit. Maternal Exam:  Uterine Assessment: Contraction strength is mild.  Contraction frequency is irregular.   Abdomen: Patient reports no abdominal tenderness. Surgical scars: low transverse.   Estimated fetal weight is 7.5 lbs.   Fetal presentation: vertex  Introitus: Normal vulva. Normal vagina.  Amniotic fluid character: not assessed.  Pelvis: adequate for delivery.      Fetal Exam Fetal Monitor Review: Mode: ultrasound.   Baseline rate: 130s.  Variability: moderate (6-25 bpm).   Pattern: no  decelerations.    Fetal State Assessment: Category I - tracings are normal.    VE-1/50/-3, vtx Physical Exam Vitals reviewed.  Constitutional:      Appearance: Normal appearance.  Cardiovascular:     Rate and Rhythm: Normal rate and regular rhythm.  Pulmonary:     Effort: Pulmonary effort is normal. No respiratory distress.  Abdominal:     Palpations: Abdomen is soft.  Genitourinary:    General: Normal vulva.  Neurological:     Mental Status: She is alert.     Prenatal labs: ABO, Rh: --/--/PENDING (04/07 2683)  A pos Antibody: PENDING (04/07 0835) neg Rubella:  immune RPR:   NR HBsAg:   neg HIV:   NR GBS:   neg  Assessment/Plan: IUP at 39 weeks, AMA, previous LTCS-CTOL and desires VBAC, for elective induction.  Will start pitocin, monitor progress, anticipate VBAC   Leighton Roach Salvatore Shear 04/06/2021, 8:50 AM

## 2021-04-06 NOTE — Anesthesia Procedure Notes (Signed)
Epidural Patient location during procedure: OB Start time: 04/06/2021 12:35 PM End time: 04/06/2021 12:38 PM  Staffing Anesthesiologist: Kaylyn Layer, MD Performed: anesthesiologist   Preanesthetic Checklist Completed: patient identified, IV checked, risks and benefits discussed, monitors and equipment checked, pre-op evaluation and timeout performed  Epidural Patient position: sitting Prep: DuraPrep and site prepped and draped Patient monitoring: continuous pulse ox, blood pressure and heart rate Approach: midline Location: L3-L4 Injection technique: LOR air  Needle:  Needle type: Tuohy  Needle gauge: 17 G Needle length: 9 cm Catheter type: closed end flexible Catheter size: 19 Gauge Catheter at skin depth: 10 cm Test dose: negative and Other (1% lidocaine)  Assessment Events: blood not aspirated, injection not painful, no injection resistance, no paresthesia and negative IV test  Additional Notes Patient identified. Risks, benefits, and alternatives discussed with patient including but not limited to bleeding, infection, nerve damage, paralysis, failed block, incomplete pain control, headache, blood pressure changes, nausea, vomiting, reactions to medication, itching, and postpartum back pain. Confirmed with bedside nurse the patient's most recent platelet count. Confirmed with patient that they are not currently taking any anticoagulation, have any bleeding history, or any family history of bleeding disorders. Patient expressed understanding and wished to proceed. All questions were answered. Sterile technique was used throughout the entire procedure. Please see nursing notes for vital signs.   Crisp LOR on first pass. Test dose was given through epidural catheter and negative prior to continuing to dose epidural or start infusion. Warning signs of high block given to the patient including shortness of breath, tingling/numbness in hands, complete motor block, or any concerning  symptoms with instructions to call for help. Patient was given instructions on fall risk and not to get out of bed. All questions and concerns addressed with instructions to call with any issues or inadequate analgesia.  Reason for block:procedure for pain

## 2021-04-06 NOTE — Anesthesia Postprocedure Evaluation (Signed)
Anesthesia Post Note  Patient: Barbara Patel  Procedure(s) Performed: CESAREAN SECTION (N/A )     Patient location during evaluation: PACU Anesthesia Type: Epidural Level of consciousness: awake and alert and oriented Pain management: pain level controlled Vital Signs Assessment: post-procedure vital signs reviewed and stable Respiratory status: spontaneous breathing, nonlabored ventilation and respiratory function stable Cardiovascular status: blood pressure returned to baseline Postop Assessment: epidural receding, no apparent nausea or vomiting, no headache and no backache Anesthetic complications: no   No complications documented.  Last Vitals:  Vitals:   04/06/21 1728 04/06/21 1744  BP: 110/71 126/65  Pulse: 63 68  Resp: 17 18  Temp:  36.7 C  SpO2: 99% 100%    Last Pain:  Vitals:   04/06/21 1744  TempSrc: Oral  PainSc: 0-No pain   Pain Goal:                Epidural/Spinal Function Cutaneous sensation: Normal sensation (04/06/21 1744), Patient able to flex knees: Yes (04/06/21 1744), Patient able to lift hips off bed: Yes (04/06/21 1744), Back pain beyond tenderness at insertion site: No (04/06/21 1744), Progressively worsening motor and/or sensory loss: No (04/06/21 1744), Bowel and/or bladder incontinence post epidural: No (04/06/21 1744)  Kaylyn Layer

## 2021-04-07 LAB — CBC
HCT: 32.9 % — ABNORMAL LOW (ref 36.0–46.0)
Hemoglobin: 11.1 g/dL — ABNORMAL LOW (ref 12.0–15.0)
MCH: 31.9 pg (ref 26.0–34.0)
MCHC: 33.7 g/dL (ref 30.0–36.0)
MCV: 94.5 fL (ref 80.0–100.0)
Platelets: 140 10*3/uL — ABNORMAL LOW (ref 150–400)
RBC: 3.48 MIL/uL — ABNORMAL LOW (ref 3.87–5.11)
RDW: 13 % (ref 11.5–15.5)
WBC: 15.8 10*3/uL — ABNORMAL HIGH (ref 4.0–10.5)
nRBC: 0 % (ref 0.0–0.2)

## 2021-04-07 MED ORDER — FAMOTIDINE 20 MG PO TABS
40.0000 mg | ORAL_TABLET | Freq: Every day | ORAL | Status: DC
  Administered 2021-04-07 – 2021-04-08 (×2): 40 mg via ORAL
  Filled 2021-04-07 (×2): qty 2

## 2021-04-07 MED ORDER — OXYCODONE HCL 5 MG PO TABS
5.0000 mg | ORAL_TABLET | ORAL | Status: DC | PRN
Start: 2021-04-07 — End: 2021-04-08
  Administered 2021-04-07: 5 mg via ORAL
  Filled 2021-04-07: qty 1

## 2021-04-07 NOTE — Progress Notes (Signed)
Subjective: Postpartum Day 1: Cesarean Delivery Patient reports incisional pain, tolerating PO and no problems voiding.    Objective: Vital signs in last 24 hours: Temp:  [97.6 F (36.4 C)-98.3 F (36.8 C)] 98 F (36.7 C) (04/08 0746) Pulse Rate:  [58-73] 61 (04/08 0746) Resp:  [11-18] 18 (04/08 0746) BP: (78-127)/(50-83) 108/65 (04/08 0746) SpO2:  [96 %-100 %] 98 % (04/08 0746)  Physical Exam:  General: alert and cooperative Lochia: appropriate Uterine Fundus: firm Incision: C/D/I   Recent Labs    04/06/21 0825 04/07/21 0547  HGB 12.8 11.1*  HCT 36.9 32.9*    Assessment/Plan: Status post Cesarean section. Doing well postoperatively.  Continue current care.  Oliver Pila 04/07/2021, 9:36 AM

## 2021-04-07 NOTE — Progress Notes (Signed)
Patient states that she can take codeine that she does not have a allergy to it ,states that she just does not like the way it makes her fell.

## 2021-04-07 NOTE — Social Work (Signed)
MOB was referred for history of anxiety.   * Referral screened out by Clinical Social Worker because none of the following criteria appear to apply:  ~ History of anxiety/depression during this pregnancy, or of post-partum depression following prior delivery. ~ Diagnosis of anxiety and/or depression within last 3 years. OR * MOB's symptoms currently being treated with medication and/or therapy. MOB currently prescribed Celexa 20mg.  Please contact the Clinical Social Worker if needs arise, by MOB request, or if MOB scores greater than 9/yes to question 10 on Edinburgh Postpartum Depression Screen.  Barbara Patel, LCSWA Clinical Social Work [facility]  [phone] 

## 2021-04-07 NOTE — Lactation Note (Signed)
This note was copied from a baby's chart. Lactation Consultation Note  Patient Name: Barbara Patel BTYOM'A Date: 04/07/2021 Reason for consult: Follow-up assessment;Mother's request;1st time breastfeeding;Term Age:41 hours   Mom states infant fed 1 hr ago. Infant 1 urine and 2 stool since birth. Infant latched in football for few minutes. LC reviewed with Mom hand expression before latching, breast compression to increase milk transfer and how to look for swallows.   Mom had pacifier in the room. LC reviewed with Mom to hold of use for 4 weeks until you establish a good latch. Mom's nipples round and erect with no signs of trauma.   Plans 1. To offer both breasts and look for swallows feeding 8-12x in 24 hr period no more than 4 hrs without an attempt.             2. Mom to paced bottle feed with her personal pump bottle and wide based nipple. Breastfeeding supplementation guide reviewed.            3. Mom to use her personal pump q 3hrs for 15 minutes, pumping after nursing.   LC sent a message via Vocera to RN Marikay Alar on the plan listed above.   Maternal Data    Feeding Mother's Current Feeding Choice: Breast Milk  LATCH Score Latch: Repeated attempts needed to sustain latch, nipple held in mouth throughout feeding, stimulation needed to elicit sucking reflex.  Audible Swallowing: Spontaneous and intermittent  Type of Nipple: Everted at rest and after stimulation  Comfort (Breast/Nipple): Soft / non-tender  Hold (Positioning): Assistance needed to correctly position infant at breast and maintain latch.  LATCH Score: 8   Lactation Tools Discussed/Used Tools: Other (comment) (Mom using her personal electric pump)  Interventions Interventions: Breast feeding basics reviewed;Breast compression;Assisted with latch;Adjust position;Skin to skin;Support pillows;Breast massage;Position options;Hand express;Education;Expressed milk (personal pump)  Discharge Pump:  Personal WIC Program: No  Consult Status Consult Status: Follow-up Date: 04/08/21 Follow-up type: In-patient    Luisana Lutzke  Nicholson-Springer 04/07/2021, 11:51 AM

## 2021-04-07 NOTE — Progress Notes (Signed)
Called Doctor for pain medication.

## 2021-04-08 MED ORDER — ACETAMINOPHEN 500 MG PO TABS: 1000.0000 mg | ORAL_TABLET | Freq: Four times a day (QID) | ORAL | 0 refills | Status: AC

## 2021-04-08 MED ORDER — IBUPROFEN 800 MG PO TABS: 800.0000 mg | ORAL_TABLET | Freq: Three times a day (TID) | ORAL | 0 refills | Status: AC

## 2021-04-08 NOTE — Discharge Summary (Signed)
Postpartum Discharge Summary       Patient Name: Barbara Patel DOB: December 21, 1980 MRN: [id number]  Date of admission: 04/06/2021 Delivery date:04/06/2021  Delivering provider: Jackelyn Knife, TODD  Date of discharge: 04/08/2021  Admitting diagnosis: Indication for care in labor or delivery [O75.9] Intrauterine pregnancy: [redacted]w[redacted]d     Secondary diagnosis:  Active Problems:   Indication for care in labor or delivery  Additional problems: none    Discharge diagnosis: Term Pregnancy Delivered                                              Post partum procedures:none Augmentation: AROM and Pitocin Complications: None  Hospital course: Induction of Labor With Cesarean Section   41 y.o. yo [id number] at [redacted]w[redacted]d was admitted to the hospital 04/06/2021 for induction of labor. Patient had a labor course significant for Category 3 tracing with prolonged decelerations. The patient went for cesarean section due to prolonged FHr decelerations. Delivery details are as follows: Membrane Rupture Time/Date: 1:04 PM ,04/06/2021   Delivery Method:C-Section, Low Transverse  Details of operation can be found in separate operative Note.  Patient had an uncomplicated postpartum course. She is ambulating, tolerating a regular diet, passing flatus, and urinating well.  Patient is discharged home in stable condition on 04/08/21.      Newborn Data: Birth date:04/06/2021  Birth time:3:47 PM  Gender:Female  Living status:Living  Apgars:4 ,6  Weight:3210 g                                 Magnesium Sulfate received: No BMZ received: Yes Rhophylac:No  Physical exam  Vitals:   04/07/21 0746 04/07/21 1455 04/07/21 2018 04/08/21 0601  BP: 108/65 114/69 125/70 117/79  Pulse: 61 62 66 (!) 58  Resp: 18 18 18 18   Temp: 98 F (36.7 C) 98.1 F (36.7 C) 98.2 F (36.8 C) 97.8 F (36.6 C)  TempSrc:  Axillary Axillary Axillary  SpO2: 98% 99% 100% 100%  Weight:      Height:       General: alert and cooperative Lochia:  appropriate Uterine Fundus: firm Incision: Dressing is clean, dry, and intact DVT Evaluation: No evidence of DVT seen on physical exam. Labs: Lab Results  Component Value Date   WBC 15.8 (H) 04/07/2021   HGB 11.1 (L) 04/07/2021   HCT 32.9 (L) 04/07/2021   MCV 94.5 04/07/2021   PLT 140 (L) 04/07/2021   CMP Latest Ref Rng & Units 05/25/2012  Glucose 70 - 99 mg/dL 05/27/2012)  BUN 6 - 23 mg/dL 13  Creatinine 433(I - 9.51 mg/dL 8.84  Sodium 1.66 - 063 mEq/L 139  Potassium 3.5 - 5.1 mEq/L 3.5  Chloride 96 - 112 mEq/L 106  CO2 19 - 32 mEq/L 24  Calcium 8.4 - 10.5 mg/dL 9.1  Total Protein 6.0 - 8.3 g/dL 6.9  Total Bilirubin 0.3 - 1.2 mg/dL 0.3  Alkaline Phos 39 - 117 U/L 57  AST 0 - 37 U/L 14  ALT 0 - 35 U/L 13   Edinburgh Score: Edinburgh Postnatal Depression Scale Screening Tool 04/07/2021  I have been able to laugh and see the funny side of things. 0  I have looked forward with enjoyment to things. 0  I have blamed myself unnecessarily when things went wrong. 0  I have been anxious or worried for no good reason. 1  I have felt scared or panicky for no good reason. 0  Things have been getting on top of me. 0  I have been so unhappy that I have had difficulty sleeping. 0  I have felt sad or miserable. 0  I have been so unhappy that I have been crying. 0  The thought of harming myself has occurred to me. 0  Edinburgh Postnatal Depression Scale Total 1     After visit meds:  Allergies as of 04/08/2021      Reactions   Shellfish Allergy Anaphylaxis   Codeine Itching, Nausea And Vomiting, Swelling      Medication List    TAKE these medications   acetaminophen 500 MG tablet Commonly known as: TYLENOL Take 2 tablets (1,000 mg total) by mouth every 6 (six) hours.   citalopram 20 MG tablet Commonly known as: CELEXA Take 20 mg by mouth at bedtime.   famotidine 20 MG tablet Commonly known as: PEPCID Take 20 mg by mouth daily as needed for heartburn or indigestion.   Fusion Plus  Caps Take 1 capsule by mouth at bedtime.   ibuprofen 800 MG tablet Commonly known as: ADVIL Take 1 tablet (800 mg total) by mouth every 8 (eight) hours.        Discharge home in stable condition Infant Feeding: Breast Infant Disposition:home with mother Discharge instruction: per After Visit Summary and Postpartum booklet. Activity: Advance as tolerated. Pelvic rest for 6 weeks.  Diet: routine diet Future Appointments:No future appointments. Follow up Visit:  Follow-up Information    Meisinger, Todd, MD. Schedule an appointment as soon as possible for a visit in 2 week(s).   Specialty: Obstetrics and Gynecology Why: incision check Contact information: [address], [address] [place] [address] [phone]                Please schedule this patient for a In person postpartum visit in 2 weeks with the following provider: MD.  Delivery mode:  C-Section, Low Transverse  Anticipated Birth Control:  Unsure   04/08/2021 Oliver Pila, MD

## 2021-04-08 NOTE — Progress Notes (Signed)
Subjective: Postpartum Day 2: Cesarean Delivery Patient reports tolerating PO, + flatus and no problems voiding.    Objective: Vital signs in last 24 hours: Temp:  [97.8 F (36.6 C)-98.2 F (36.8 C)] 97.8 F (36.6 C) (04/09 0601) Pulse Rate:  [58-66] 58 (04/09 0601) Resp:  [18] 18 (04/09 0601) BP: (114-125)/(69-79) 117/79 (04/09 0601) SpO2:  [99 %-100 %] 100 % (04/09 0601)  Physical Exam:  General: alert and cooperative Lochia: appropriate Uterine Fundus: firm Incision: C/D/i   Recent Labs    04/06/21 0825 04/07/21 0547  HGB 12.8 11.1*  HCT 36.9 32.9*    Assessment/Plan: Status post Cesarean section. Doing well postoperatively.  Discharge home with standard precautions and return to clinic in 2 weeks.  Oliver Pila 04/08/2021, 11:48 AM

## 2021-04-09 ENCOUNTER — Encounter (HOSPITAL_COMMUNITY): Payer: Self-pay | Admitting: Obstetrics and Gynecology

## 2022-01-19 ENCOUNTER — Other Ambulatory Visit (HOSPITAL_BASED_OUTPATIENT_CLINIC_OR_DEPARTMENT_OTHER): Payer: Self-pay

## 2022-01-19 MED ORDER — AMPHETAMINE-DEXTROAMPHETAMINE 20 MG PO TABS
ORAL_TABLET | ORAL | 0 refills | Status: DC
  Filled 2022-01-19: qty 90, 30d supply, fill #0

## 2022-02-19 ENCOUNTER — Other Ambulatory Visit (HOSPITAL_BASED_OUTPATIENT_CLINIC_OR_DEPARTMENT_OTHER): Payer: Self-pay

## 2022-02-19 MED ORDER — AMPHETAMINE-DEXTROAMPHETAMINE 20 MG PO TABS
ORAL_TABLET | ORAL | 0 refills | Status: DC
  Filled 2022-02-19: qty 90, 30d supply, fill #0

## 2022-02-19 MED ORDER — AMPHETAMINE-DEXTROAMPHETAMINE 20 MG PO TABS
ORAL_TABLET | ORAL | 0 refills | Status: DC
  Filled 2022-03-23: qty 90, 30d supply, fill #0

## 2022-02-19 MED ORDER — AMPHETAMINE-DEXTROAMPHETAMINE 20 MG PO TABS
ORAL_TABLET | ORAL | 0 refills | Status: DC
  Filled 2022-04-30: qty 90, 30d supply, fill #0

## 2022-02-21 ENCOUNTER — Other Ambulatory Visit (HOSPITAL_BASED_OUTPATIENT_CLINIC_OR_DEPARTMENT_OTHER): Payer: Self-pay

## 2022-03-23 ENCOUNTER — Other Ambulatory Visit (HOSPITAL_BASED_OUTPATIENT_CLINIC_OR_DEPARTMENT_OTHER): Payer: Self-pay

## 2022-04-30 ENCOUNTER — Other Ambulatory Visit (HOSPITAL_BASED_OUTPATIENT_CLINIC_OR_DEPARTMENT_OTHER): Payer: Self-pay

## 2022-06-05 ENCOUNTER — Other Ambulatory Visit (HOSPITAL_BASED_OUTPATIENT_CLINIC_OR_DEPARTMENT_OTHER): Payer: Self-pay

## 2022-06-05 MED ORDER — AMPHETAMINE-DEXTROAMPHETAMINE 20 MG PO TABS
ORAL_TABLET | ORAL | 0 refills | Status: DC
  Filled 2022-08-16 (×2): qty 90, 30d supply, fill #0

## 2022-06-05 MED ORDER — AMPHETAMINE-DEXTROAMPHETAMINE 20 MG PO TABS
ORAL_TABLET | ORAL | 0 refills | Status: DC
  Filled 2022-06-05 – 2022-07-13 (×2): qty 90, 30d supply, fill #0

## 2022-06-05 MED ORDER — AMPHETAMINE-DEXTROAMPHETAMINE 20 MG PO TABS
20.0000 mg | ORAL_TABLET | Freq: Three times a day (TID) | ORAL | 0 refills | Status: DC
  Filled 2022-09-17: qty 90, 30d supply, fill #0

## 2022-06-08 ENCOUNTER — Other Ambulatory Visit (HOSPITAL_BASED_OUTPATIENT_CLINIC_OR_DEPARTMENT_OTHER): Payer: Self-pay

## 2022-06-18 ENCOUNTER — Other Ambulatory Visit (HOSPITAL_BASED_OUTPATIENT_CLINIC_OR_DEPARTMENT_OTHER): Payer: Self-pay

## 2022-07-13 ENCOUNTER — Other Ambulatory Visit (HOSPITAL_BASED_OUTPATIENT_CLINIC_OR_DEPARTMENT_OTHER): Payer: Self-pay

## 2022-07-21 ENCOUNTER — Other Ambulatory Visit: Payer: Self-pay

## 2022-07-21 ENCOUNTER — Encounter (HOSPITAL_BASED_OUTPATIENT_CLINIC_OR_DEPARTMENT_OTHER): Payer: Self-pay | Admitting: Emergency Medicine

## 2022-07-21 ENCOUNTER — Emergency Department (HOSPITAL_BASED_OUTPATIENT_CLINIC_OR_DEPARTMENT_OTHER)
Admission: EM | Admit: 2022-07-21 | Discharge: 2022-07-21 | Disposition: A | Discharge status: Home / Self Care | Payer: Medicaid Other | Attending: Emergency Medicine | Admitting: Emergency Medicine

## 2022-07-21 DIAGNOSIS — T63441A Toxic effect of venom of bees, accidental (unintentional), initial encounter: Secondary | ICD-10-CM | POA: Insufficient documentation

## 2022-07-21 DIAGNOSIS — T7840XA Allergy, unspecified, initial encounter: Secondary | ICD-10-CM | POA: Insufficient documentation

## 2022-07-21 DIAGNOSIS — Z91030 Bee allergy status: Secondary | ICD-10-CM | POA: Insufficient documentation

## 2022-07-21 MED ORDER — METHYLPREDNISOLONE SODIUM SUCC 125 MG IJ SOLR
125.0000 mg | Freq: Once | INTRAMUSCULAR | Status: AC
  Administered 2022-07-21: 125 mg via INTRAVENOUS
  Filled 2022-07-21: qty 2

## 2022-07-21 MED ORDER — FAMOTIDINE IN NACL 20-0.9 MG/50ML-% IV SOLN
20.0000 mg | Freq: Once | INTRAVENOUS | Status: AC
  Administered 2022-07-21: 20 mg via INTRAVENOUS
  Filled 2022-07-21: qty 50

## 2022-07-21 MED ORDER — PREDNISONE 20 MG PO TABS: ORAL_TABLET | ORAL | 0 refills | Status: AC

## 2022-07-21 MED ORDER — DIPHENHYDRAMINE HCL 50 MG/ML IJ SOLN: 25.0000 mg | Freq: Once | INTRAMUSCULAR | Status: DC

## 2022-07-21 NOTE — ED Triage Notes (Signed)
Pt here from home with c/o allergic reaction to a bee sting , pt took 75mg  benadryl , pt has hives to buttock armpits and torso

## 2022-07-21 NOTE — ED Provider Notes (Addendum)
[facility] EMERGENCY DEPT Provider Note   CSN: [id number] Arrival date & time: 07/21/22  2154     History  Chief Complaint  Patient presents with   Allergic Reaction    Barbara Patel is a 42 y.o. female.  Patient is a 42 year old female who presents with allergic reaction.  She was stung by a bee in the left buttocks about 6 hours ago.  She said she had a little shortness of breath initially but took some Benadryl and that has resolved.  She said she felt like there was some swelling in the back of her throat initially but that also has resolved.  She complains of hives and itching all over.  She has swelling to her left buttocks where the bee stung her.  She said she has had a prior reaction to bee stings but not this bad.  She has never had to use an EpiPen.  Her last Benadryl was about 4 hours ago.       Home Medications Prior to Admission medications   Medication Sig Start Date End Date Taking? Authorizing Provider  predniSONE (DELTASONE) 20 MG tablet 2 tabs po daily x 4 days 07/21/22  Yes Rolan Bucco, MD  acetaminophen (TYLENOL) 500 MG tablet Take 2 tablets (1,000 mg total) by mouth every 6 (six) hours. 04/08/21   Huel Cote, MD  amphetamine-dextroamphetamine (ADDERALL) 20 MG tablet 1 PO TID 01/19/22     amphetamine-dextroamphetamine (ADDERALL) 20 MG tablet 1 PO TID 03/21/22     amphetamine-dextroamphetamine (ADDERALL) 20 MG tablet 1 PO TID 02/19/22     amphetamine-dextroamphetamine (ADDERALL) 20 MG tablet 1 PO TID 08/04/22     amphetamine-dextroamphetamine (ADDERALL) 20 MG tablet 1 PO TID 06/05/22     amphetamine-dextroamphetamine (ADDERALL) 20 MG tablet 1 PO TID 07/05/22     citalopram (CELEXA) 20 MG tablet Take 20 mg by mouth at bedtime.    [provider]  famotidine (PEPCID) 20 MG tablet Take 20 mg by mouth daily as needed for heartburn or indigestion.    [provider]  ibuprofen (ADVIL) 800 MG tablet Take 1 tablet (800 mg total) by  mouth every 8 (eight) hours. 04/08/21   Huel Cote, MD  Iron-FA-B Cmp-C-Biot-Probiotic (FUSION PLUS) CAPS Take 1 capsule by mouth at bedtime.    [provider]      Allergies    Bee venom, Shellfish allergy, and Codeine    Review of Systems   Review of Systems  Constitutional:  Negative for chills, diaphoresis, fatigue and fever.  HENT:  Positive for facial swelling. Negative for congestion, rhinorrhea and sneezing.   Eyes: Negative.   Respiratory:  Positive for shortness of breath. Negative for cough and chest tightness.   Cardiovascular:  Negative for chest pain and leg swelling.  Gastrointestinal:  Negative for abdominal pain, blood in stool, diarrhea, nausea and vomiting.  Genitourinary:  Negative for difficulty urinating, flank pain, frequency and hematuria.  Musculoskeletal:  Negative for arthralgias and back pain.  Skin:  Positive for rash.  Neurological:  Negative for dizziness, speech difficulty, weakness, numbness and headaches.    Physical Exam Updated Vital Signs BP (!) 150/94   Pulse 92   Temp 98.6 F (37 C) (Oral)   Resp 16   Ht 5\' 5"  (1.651 m)   Wt 81.6 kg   SpO2 99%   BMI 29.95 kg/m  Physical Exam Constitutional:      Appearance: She is well-developed.  HENT:     Head: Normocephalic and  atraumatic.     Mouth/Throat:     Comments: No angioedema Eyes:     Pupils: Pupils are equal, round, and reactive to light.  Cardiovascular:     Rate and Rhythm: Normal rate and regular rhythm.     Heart sounds: Normal heart sounds.  Pulmonary:     Effort: Pulmonary effort is normal. No respiratory distress.     Breath sounds: Normal breath sounds. No wheezing or rales.  Chest:     Chest wall: No tenderness.  Abdominal:     General: Bowel sounds are normal.     Palpations: Abdomen is soft.     Tenderness: There is no abdominal tenderness. There is no guarding or rebound.  Musculoskeletal:        General: Normal range of motion.     Cervical back:  Normal range of motion and neck supple.  Lymphadenopathy:     Cervical: No cervical adenopathy.  Skin:    General: Skin is warm and dry.     Findings: Rash present.     Comments: Patient has some warmth and swelling with redness to her left buttocks.  Its indurated.  I do not see any stingers in place.  She has an urticarial type rash to her torso.  It is blanching, no petechiae or purpura, no vesicles  Neurological:     Mental Status: She is alert and oriented to person, place, and time.     ED Results / Procedures / Treatments   Labs (all labs ordered are listed, but only abnormal results are displayed) Labs Reviewed - No data to display  EKG None  Radiology No results found.  Procedures Procedures    Medications Ordered in ED Medications  diphenhydrAMINE (BENADRYL) injection 25 mg (has no administration in time range)  methylPREDNISolone sodium succinate (SOLU-MEDROL) 125 mg/2 mL injection 125 mg (125 mg Intravenous Given 07/21/22 2242)  famotidine (PEPCID) IVPB 20 mg premix (20 mg Intravenous New Bag/Given 07/21/22 2242)    ED Course/ Medical Decision Making/ A&P                           Medical Decision Making Problems Addressed: Allergic reaction, initial encounter: acute illness or injury with systemic symptoms Bee sting, accidental or unintentional, initial encounter: acute illness or injury with systemic symptoms  Risk Prescription drug management. Decision regarding hospitalization.   Patient is a 42 year old female who presents after a bee sting to her buttocks area.  She does have some localized swelling and induration to her buttocks.  It happened 6 hours ago.  Also has hives.  She initially had some facial swelling which resolved with Benadryl.  At this point I do not feel that epinephrine is indicated given that it was 6 hours ago that she had this facial swelling and shortness of breath.  She has not had any since that time.  She was given a dose of  Solu-Medrol in the ED as well as Pepcid and Benadryl.  She is feeling much better after this.  Her hives and itching have resolved.  She feels like her buttock swelling is gone down a little bit.  She was using ice packs to the area.  I would not anticipate that this is infection given that it only happened 6 hours ago.  I think that the buttock swelling is just a localized reaction to the bee sting.  She was given symptomatic care instructions.  She was given a prescription  for a 4-day course of prednisone.  She has an EpiPen at home in case she needs it.  Return precautions were given.  Final Clinical Impression(s) / ED Diagnoses Final diagnoses:  Allergic reaction, initial encounter  Bee sting, accidental or unintentional, initial encounter    Rx / DC Orders ED Discharge Orders          Ordered    predniSONE (DELTASONE) 20 MG tablet        07/21/22 2319              Rolan Bucco, MD 07/21/22 3790    Rolan Bucco, MD 07/21/22 2322

## 2022-07-21 NOTE — ED Notes (Signed)
RT place PIV in right [facility] and obtained labs at this time. PIV secure, flushed and saline locked. RN notified of placement.

## 2022-08-13 ENCOUNTER — Other Ambulatory Visit (HOSPITAL_BASED_OUTPATIENT_CLINIC_OR_DEPARTMENT_OTHER): Payer: Self-pay

## 2022-08-16 ENCOUNTER — Other Ambulatory Visit (HOSPITAL_BASED_OUTPATIENT_CLINIC_OR_DEPARTMENT_OTHER): Payer: Self-pay

## 2022-09-17 ENCOUNTER — Other Ambulatory Visit (HOSPITAL_BASED_OUTPATIENT_CLINIC_OR_DEPARTMENT_OTHER): Payer: Self-pay

## 2022-10-22 ENCOUNTER — Other Ambulatory Visit (HOSPITAL_BASED_OUTPATIENT_CLINIC_OR_DEPARTMENT_OTHER): Payer: Self-pay

## 2022-10-22 MED ORDER — AMPHETAMINE-DEXTROAMPHETAMINE 20 MG PO TABS
20.0000 mg | ORAL_TABLET | Freq: Three times a day (TID) | ORAL | 0 refills | Status: DC
  Filled 2022-10-22: qty 90, 30d supply, fill #0

## 2022-11-23 ENCOUNTER — Other Ambulatory Visit (HOSPITAL_BASED_OUTPATIENT_CLINIC_OR_DEPARTMENT_OTHER): Payer: Self-pay

## 2022-11-26 ENCOUNTER — Other Ambulatory Visit (HOSPITAL_BASED_OUTPATIENT_CLINIC_OR_DEPARTMENT_OTHER): Payer: Self-pay

## 2022-11-26 MED ORDER — AMPHETAMINE-DEXTROAMPHETAMINE 20 MG PO TABS
20.0000 mg | ORAL_TABLET | Freq: Three times a day (TID) | ORAL | 0 refills | Status: DC
  Filled 2022-11-26: qty 90, 30d supply, fill #0

## 2022-11-29 ENCOUNTER — Other Ambulatory Visit (HOSPITAL_BASED_OUTPATIENT_CLINIC_OR_DEPARTMENT_OTHER): Payer: Self-pay

## 2022-12-03 ENCOUNTER — Other Ambulatory Visit (HOSPITAL_BASED_OUTPATIENT_CLINIC_OR_DEPARTMENT_OTHER): Payer: Self-pay

## 2023-01-04 ENCOUNTER — Other Ambulatory Visit (HOSPITAL_BASED_OUTPATIENT_CLINIC_OR_DEPARTMENT_OTHER): Payer: Self-pay

## 2023-01-04 MED ORDER — AMPHETAMINE-DEXTROAMPHETAMINE 20 MG PO TABS
20.0000 mg | ORAL_TABLET | Freq: Three times a day (TID) | ORAL | 0 refills | Status: DC
  Filled 2023-01-04: qty 90, 30d supply, fill #0

## 2023-02-05 ENCOUNTER — Other Ambulatory Visit (HOSPITAL_BASED_OUTPATIENT_CLINIC_OR_DEPARTMENT_OTHER): Payer: Self-pay

## 2023-02-05 MED ORDER — AMPHETAMINE-DEXTROAMPHETAMINE 20 MG PO TABS
20.0000 mg | ORAL_TABLET | Freq: Three times a day (TID) | ORAL | 0 refills | Status: DC
  Filled 2023-02-05: qty 90, 30d supply, fill #0

## 2023-03-07 ENCOUNTER — Other Ambulatory Visit (HOSPITAL_BASED_OUTPATIENT_CLINIC_OR_DEPARTMENT_OTHER): Payer: Self-pay

## 2023-03-07 MED ORDER — AMPHETAMINE-DEXTROAMPHETAMINE 20 MG PO TABS
20.0000 mg | ORAL_TABLET | Freq: Three times a day (TID) | ORAL | 0 refills | Status: DC
  Filled 2023-03-07: qty 90, 30d supply, fill #0

## 2023-03-08 ENCOUNTER — Other Ambulatory Visit: Payer: Self-pay

## 2023-03-08 ENCOUNTER — Other Ambulatory Visit (HOSPITAL_BASED_OUTPATIENT_CLINIC_OR_DEPARTMENT_OTHER): Payer: Self-pay

## 2023-04-09 ENCOUNTER — Other Ambulatory Visit (HOSPITAL_BASED_OUTPATIENT_CLINIC_OR_DEPARTMENT_OTHER): Payer: Self-pay

## 2023-04-09 MED ORDER — AMPHETAMINE-DEXTROAMPHETAMINE 20 MG PO TABS
20.0000 mg | ORAL_TABLET | Freq: Three times a day (TID) | ORAL | 0 refills | Status: DC
  Filled 2023-04-09: qty 90, 30d supply, fill #0

## 2023-05-09 ENCOUNTER — Other Ambulatory Visit (HOSPITAL_BASED_OUTPATIENT_CLINIC_OR_DEPARTMENT_OTHER): Payer: Self-pay

## 2023-05-09 MED ORDER — AMPHETAMINE-DEXTROAMPHETAMINE 20 MG PO TABS
20.0000 mg | ORAL_TABLET | Freq: Three times a day (TID) | ORAL | 0 refills | Status: DC
  Filled 2023-05-09: qty 90, 30d supply, fill #0

## 2023-05-10 ENCOUNTER — Other Ambulatory Visit: Payer: Self-pay

## 2023-05-10 ENCOUNTER — Other Ambulatory Visit (HOSPITAL_BASED_OUTPATIENT_CLINIC_OR_DEPARTMENT_OTHER): Payer: Self-pay

## 2023-06-10 ENCOUNTER — Other Ambulatory Visit (HOSPITAL_BASED_OUTPATIENT_CLINIC_OR_DEPARTMENT_OTHER): Payer: Self-pay

## 2023-06-10 MED ORDER — AMPHETAMINE-DEXTROAMPHETAMINE 20 MG PO TABS
20.0000 mg | ORAL_TABLET | Freq: Three times a day (TID) | ORAL | 0 refills | Status: DC
  Filled 2023-06-10: qty 90, 30d supply, fill #0

## 2023-07-11 ENCOUNTER — Other Ambulatory Visit (HOSPITAL_BASED_OUTPATIENT_CLINIC_OR_DEPARTMENT_OTHER): Payer: Self-pay

## 2023-07-11 MED ORDER — AMPHETAMINE-DEXTROAMPHETAMINE 20 MG PO TABS
20.0000 mg | ORAL_TABLET | Freq: Three times a day (TID) | ORAL | 0 refills | Status: DC
  Filled 2023-07-11: qty 90, 30d supply, fill #0

## 2023-08-12 ENCOUNTER — Other Ambulatory Visit (HOSPITAL_BASED_OUTPATIENT_CLINIC_OR_DEPARTMENT_OTHER): Payer: Self-pay

## 2023-08-12 MED ORDER — AMPHETAMINE-DEXTROAMPHETAMINE 20 MG PO TABS
20.0000 mg | ORAL_TABLET | Freq: Three times a day (TID) | ORAL | 0 refills | Status: DC
  Filled 2023-08-12: qty 90, 30d supply, fill #0

## 2023-08-13 ENCOUNTER — Other Ambulatory Visit (HOSPITAL_BASED_OUTPATIENT_CLINIC_OR_DEPARTMENT_OTHER): Payer: Self-pay

## 2023-09-13 ENCOUNTER — Other Ambulatory Visit (HOSPITAL_BASED_OUTPATIENT_CLINIC_OR_DEPARTMENT_OTHER): Payer: Self-pay

## 2023-09-13 MED ORDER — AMPHETAMINE-DEXTROAMPHETAMINE 20 MG PO TABS
20.0000 mg | ORAL_TABLET | Freq: Three times a day (TID) | ORAL | 0 refills | Status: DC
  Filled 2023-09-13: qty 90, 30d supply, fill #0

## 2023-09-16 ENCOUNTER — Encounter: Payer: Medicaid Other | Admitting: Plastic Surgery

## 2023-09-26 ENCOUNTER — Other Ambulatory Visit: Payer: Self-pay

## 2023-09-27 ENCOUNTER — Ambulatory Visit (INDEPENDENT_AMBULATORY_CARE_PROVIDER_SITE_OTHER): Payer: Self-pay | Admitting: Plastic Surgery

## 2023-09-27 DIAGNOSIS — Z719 Counseling, unspecified: Secondary | ICD-10-CM | POA: Insufficient documentation
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
## 2023-10-01 ENCOUNTER — Ambulatory Visit (INDEPENDENT_AMBULATORY_CARE_PROVIDER_SITE_OTHER): Payer: Self-pay | Admitting: Student

## 2023-10-01 DIAGNOSIS — Z719 Counseling, unspecified: Secondary | ICD-10-CM

## 2023-10-01 NOTE — Progress Notes (Signed)
Patient is a 43 year old female with history of scars to her face.  She states that she had several dental abscesses which resulted in her needing to have all of her teeth removed.  She states that she has scars to her face from the incisions and from picking at the incisions.  She states that she would like to have something done about the scars.  We discussed the risks of BBL, most notably burns.  Patient denies being pregnant, taking any photosensitive medications, taking any blood thinners, having any immunocompromise diseases, any history of skin cancer, or history of keloids.  She was to proceed with the procedure.  Discussed with patient given the condition of her skin currently, we will start with very low settings and increase slowly from there at subsequent visits.  Patient expressed understanding was in agreement.   Preoperative Dx: Scarring to the face  Postoperative Dx:  same  Procedure: laser to face   Anesthesia: none  Description of Procedure:  Risks and complications were explained to the patient. Consent was confirmed and signed. Time out was called and all information was confirmed to be correct. The area  area was prepped with alcohol and wiped dry.  Ultrasound jelly was applied to the face.  BBL heroic laser was set at 2 J/cm2 using 532 nm filter with 3 pulse width and 25 degrees Celsius cooling temperature with 10% overlap. The face was lasered. The patient tolerated the procedure well and there were no complications. The patient is to follow up in 4 weeks.

## 2023-10-16 ENCOUNTER — Other Ambulatory Visit (HOSPITAL_BASED_OUTPATIENT_CLINIC_OR_DEPARTMENT_OTHER): Payer: Self-pay

## 2023-10-16 MED ORDER — AMPHETAMINE-DEXTROAMPHETAMINE 20 MG PO TABS
20.0000 mg | ORAL_TABLET | Freq: Three times a day (TID) | ORAL | 0 refills | Status: DC
  Filled 2023-10-16: qty 90, 30d supply, fill #0

## 2023-10-29 ENCOUNTER — Ambulatory Visit (INDEPENDENT_AMBULATORY_CARE_PROVIDER_SITE_OTHER): Payer: Self-pay | Admitting: Student

## 2023-10-29 DIAGNOSIS — Z719 Counseling, unspecified: Secondary | ICD-10-CM

## 2023-10-29 NOTE — Progress Notes (Signed)
Patient reports she is doing well.  She states that she had a very minimal response to the laser last time and did not have any issues afterward.  She wishes to proceed with another session today.   Preoperative Dx: Scarring to the face  Postoperative Dx:  same  Procedure: laser to face   Anesthesia: none  Description of Procedure:  Risks and complications were explained to the patient. Consent was confirmed. Time out was called and all information was confirmed to be correct. The area  area was prepped with alcohol and wiped dry.  Ultrasound jelly was applied to the face.  BBL heroic laser was set at 3 J/cm2 using 532+ nm filter with 3 width and 25 degrees Celsius with a 10% overlap. The face was lasered. The patient tolerated the procedure well and there were no complications. The patient is to follow up in 4 weeks.

## 2023-11-18 ENCOUNTER — Other Ambulatory Visit (HOSPITAL_BASED_OUTPATIENT_CLINIC_OR_DEPARTMENT_OTHER): Payer: Self-pay

## 2023-11-18 MED ORDER — AMPHETAMINE-DEXTROAMPHETAMINE 20 MG PO TABS
20.0000 mg | ORAL_TABLET | Freq: Three times a day (TID) | ORAL | 0 refills | Status: DC
  Filled 2023-11-18: qty 90, 30d supply, fill #0

## 2023-11-27 ENCOUNTER — Ambulatory Visit (INDEPENDENT_AMBULATORY_CARE_PROVIDER_SITE_OTHER): Payer: Self-pay | Admitting: Student

## 2023-11-27 DIAGNOSIS — Z719 Counseling, unspecified: Secondary | ICD-10-CM

## 2023-11-27 NOTE — Progress Notes (Signed)
Today, patient is doing well.  She states that she tolerated the BBL well last time.  Denied any complaints since then.  She states that she feels her redness is improving.  She states that her boyfriend also stated that he felt the redness was improving.  Preoperative Dx: Scarring to the face  Postoperative Dx:  same  Procedure: laser to face   Anesthesia: none  Description of Procedure:  Risks and complications were explained to the patient. Consent was confirmed. Time out was called and all information was confirmed to be correct. The area  area was prepped with alcohol and wiped dry.  Ultrasound jelly was applied to the face.  BBL heroic laser was set at 3.5 J/cm2 using 532+ nm filter with 3 width and 25 degrees Celsius. The face was lasered. The patient tolerated the procedure well and there were no complications. The patient is to follow up in 4 weeks.  Patient to also follow-up in 8 weeks with Dr. Ulice Bold to discuss further laser treatments.

## 2023-12-19 ENCOUNTER — Other Ambulatory Visit (HOSPITAL_BASED_OUTPATIENT_CLINIC_OR_DEPARTMENT_OTHER): Payer: Self-pay

## 2023-12-19 MED ORDER — AMPHETAMINE-DEXTROAMPHETAMINE 20 MG PO TABS
20.0000 mg | ORAL_TABLET | Freq: Three times a day (TID) | ORAL | 0 refills | Status: DC
  Filled 2023-12-19: qty 90, 30d supply, fill #0

## 2023-12-23 ENCOUNTER — Ambulatory Visit (INDEPENDENT_AMBULATORY_CARE_PROVIDER_SITE_OTHER): Payer: Self-pay | Admitting: Student

## 2023-12-23 DIAGNOSIS — Z719 Counseling, unspecified: Secondary | ICD-10-CM

## 2023-12-23 NOTE — Progress Notes (Signed)
Today, patient is doing well.  She states that she is felt that there has been improvement in her pigmentation to her face since starting the BBL treatments.  She states that she would like another treatment today.  Preoperative Dx: Scarring and pigmentation to the face  Postoperative Dx:  same  Procedure: laser to face   Anesthesia: none  Description of Procedure:  Risks and complications were explained to the patient. Consent was confirmed. Time out was called and all information was confirmed to be correct. The area  area was prepped with alcohol and wiped dry.  Ultrasound jelly was applied to the face.  BBL heroic laser was set at 3.5 J/cm using the 532+ nanometers filter with 3 pulse width and 25 C cooling.  The face was lasered.  The patient tolerated the procedure well and there were no complications.   The patient is to follow up in 4 weeks with Dr. Ulice Bold to discuss further possible laser treatments for improving texture to the face.  Pictures were obtained of the patient and placed in the chart with the patient's or guardian's permission.

## 2024-01-21 ENCOUNTER — Encounter: Payer: Self-pay | Admitting: Plastic Surgery

## 2024-01-21 ENCOUNTER — Other Ambulatory Visit (HOSPITAL_BASED_OUTPATIENT_CLINIC_OR_DEPARTMENT_OTHER): Payer: Self-pay

## 2024-01-21 ENCOUNTER — Ambulatory Visit: Payer: Self-pay | Admitting: Plastic Surgery

## 2024-01-21 ENCOUNTER — Telehealth: Payer: Self-pay | Admitting: Plastic Surgery

## 2024-01-21 DIAGNOSIS — Z719 Counseling, unspecified: Secondary | ICD-10-CM

## 2024-01-21 MED ORDER — LIDOCAINE 23% - TETRACAINE 7% TOPICAL OINTMENT (PLASTICIZED)
1.0000 | TOPICAL_OINTMENT | Freq: Once | CUTANEOUS | 0 refills | Status: AC
  Filled 2024-01-21: qty 60, 30d supply, fill #0

## 2024-01-21 NOTE — Telephone Encounter (Signed)
Per Dr D, apt for laser, Pt stated Dr D told her to be here 40 mins early and cream will be ordered at [facility]

## 2024-01-21 NOTE — Progress Notes (Signed)
The patient is a 44 year old female here for more discussion about her face.  She has had room for laser treatments with the BBL.  The patient states that she did feel like it gave some improvement with less discoloration to her face.  She is now ready to see if she could step it up and go a little bit deeper.  The TRL is a good option for her.  I would like to get pictures before we do the TRL.

## 2024-01-22 ENCOUNTER — Other Ambulatory Visit (HOSPITAL_BASED_OUTPATIENT_CLINIC_OR_DEPARTMENT_OTHER): Payer: Self-pay

## 2024-01-22 MED ORDER — AMPHETAMINE-DEXTROAMPHETAMINE 20 MG PO TABS: 20.0000 mg | ORAL_TABLET | Freq: Three times a day (TID) | ORAL | 0 refills | Status: DC

## 2024-01-22 MED ORDER — AMPHETAMINE-DEXTROAMPHETAMINE 20 MG PO TABS
20.0000 mg | ORAL_TABLET | Freq: Three times a day (TID) | ORAL | 0 refills | Status: DC
  Filled 2024-01-22: qty 90, 30d supply, fill #0

## 2024-02-07 ENCOUNTER — Ambulatory Visit: Payer: Medicaid Other | Admitting: Plastic Surgery

## 2024-02-07 DIAGNOSIS — Z719 Counseling, unspecified: Secondary | ICD-10-CM

## 2024-02-07 NOTE — Progress Notes (Signed)
 Preoperative Dx: Facial scaring  Postoperative Dx:  same  Procedure: laser to Face   Anesthesia: none  Description of Procedure:  Risks and complications were explained to the patient. Consent was confirmed and signed. Eye protection was placed. Time out was called and all information was confirmed to be correct. The area  area was prepped with alcohol and wiped dry. The TRL laser was set at 10 mm and 2.5 J/cm2. The face was lasered. The patient tolerated the procedure well and there were no complications. The patient is to follow up in 4 weeks.

## 2024-02-25 ENCOUNTER — Ambulatory Visit (INDEPENDENT_AMBULATORY_CARE_PROVIDER_SITE_OTHER): Payer: Self-pay | Admitting: Plastic Surgery

## 2024-02-25 DIAGNOSIS — Z719 Counseling, unspecified: Secondary | ICD-10-CM

## 2024-02-25 NOTE — Progress Notes (Signed)
 Preoperative Dx: scarring of face  Postoperative Dx:  same  Procedure: laser to face   Anesthesia: none  Description of Procedure:  Risks and complications were explained to the patient. Consent was confirmed and signed. Eye protection was placed. Time out was called and all information was confirmed to be correct. The area  area was prepped with alcohol and wiped dry. The TRL laser was set at 15. The face was lasered. The patient tolerated the procedure well and there were no complications. The patient is to follow up in 4 weeks.

## 2024-02-28 ENCOUNTER — Other Ambulatory Visit (HOSPITAL_BASED_OUTPATIENT_CLINIC_OR_DEPARTMENT_OTHER): Payer: Self-pay

## 2024-02-28 MED ORDER — AMPHETAMINE-DEXTROAMPHETAMINE 20 MG PO TABS
20.0000 mg | ORAL_TABLET | Freq: Three times a day (TID) | ORAL | 0 refills | Status: DC
  Filled 2024-02-28: qty 90, 30d supply, fill #0

## 2024-03-02 ENCOUNTER — Other Ambulatory Visit (HOSPITAL_BASED_OUTPATIENT_CLINIC_OR_DEPARTMENT_OTHER): Payer: Self-pay

## 2024-03-24 ENCOUNTER — Ambulatory Visit (INDEPENDENT_AMBULATORY_CARE_PROVIDER_SITE_OTHER): Payer: Self-pay | Admitting: Plastic Surgery

## 2024-03-24 ENCOUNTER — Encounter: Payer: Self-pay | Admitting: Plastic Surgery

## 2024-03-24 DIAGNOSIS — Z719 Counseling, unspecified: Secondary | ICD-10-CM

## 2024-03-24 NOTE — Progress Notes (Signed)
 Preoperative Dx: Facial scarring  Postoperative Dx:  same  Procedure: laser to face  Anesthesia: none  Description of Procedure:  Risks and complications were explained to the patient. Consent was confirmed and signed. Eye protection was placed. Time out was called and all information was confirmed to be correct. The area  area was prepped with alcohol and wiped dry. The TRL laser was set at 25. The face was lasered. The patient tolerated the procedure well and there were no complications. The patient is to follow up in 4 weeks.

## 2024-03-30 ENCOUNTER — Other Ambulatory Visit (HOSPITAL_BASED_OUTPATIENT_CLINIC_OR_DEPARTMENT_OTHER): Payer: Self-pay

## 2024-03-31 ENCOUNTER — Other Ambulatory Visit (HOSPITAL_BASED_OUTPATIENT_CLINIC_OR_DEPARTMENT_OTHER): Payer: Self-pay

## 2024-03-31 MED ORDER — AMPHETAMINE-DEXTROAMPHETAMINE 20 MG PO TABS
20.0000 mg | ORAL_TABLET | Freq: Three times a day (TID) | ORAL | 0 refills | Status: DC
  Filled 2024-03-31: qty 90, 30d supply, fill #0

## 2024-04-01 ENCOUNTER — Other Ambulatory Visit (HOSPITAL_BASED_OUTPATIENT_CLINIC_OR_DEPARTMENT_OTHER): Payer: Self-pay

## 2024-04-30 ENCOUNTER — Other Ambulatory Visit (HOSPITAL_BASED_OUTPATIENT_CLINIC_OR_DEPARTMENT_OTHER): Payer: Self-pay

## 2024-04-30 MED ORDER — AMPHETAMINE-DEXTROAMPHETAMINE 20 MG PO TABS
20.0000 mg | ORAL_TABLET | Freq: Three times a day (TID) | ORAL | 0 refills | Status: AC
  Filled 2024-05-02: qty 90, 30d supply, fill #0

## 2024-04-30 MED ORDER — AMPHETAMINE-DEXTROAMPHETAMINE 20 MG PO TABS
20.0000 mg | ORAL_TABLET | Freq: Three times a day (TID) | ORAL | 0 refills | Status: DC
  Filled 2024-09-03: qty 90, 30d supply, fill #0

## 2024-05-01 ENCOUNTER — Other Ambulatory Visit (HOSPITAL_COMMUNITY): Payer: Self-pay

## 2024-05-01 ENCOUNTER — Ambulatory Visit (INDEPENDENT_AMBULATORY_CARE_PROVIDER_SITE_OTHER): Payer: Self-pay | Admitting: Plastic Surgery

## 2024-05-01 DIAGNOSIS — Z719 Counseling, unspecified: Secondary | ICD-10-CM

## 2024-05-01 MED ORDER — LIDOCAINE 23% - TETRACAINE 7% TOPICAL OINTMENT (PLASTICIZED)
1.0000 | TOPICAL_OINTMENT | Freq: Once | CUTANEOUS | 0 refills | Status: AC
  Filled 2024-05-01: qty 60, 30d supply, fill #0

## 2024-05-01 NOTE — Progress Notes (Signed)
 Preoperative Dx: facial scarring  Postoperative Dx:  same  Procedure: laser to face   Anesthesia: none  Description of Procedure:  Risks and complications were explained to the patient. Consent was confirmed and signed. Eye protection was placed. Time out was called and all information was confirmed to be correct. The area  area was prepped with alcohol and wiped dry. The TRL laser was set at 25. The face was lasered. The patient tolerated the procedure well and there were no complications. The patient is to follow up in 4 weeks.

## 2024-05-01 NOTE — Addendum Note (Signed)
 Addended by: Thornell Flirt on: 05/01/2024 02:53 PM   Modules accepted: Orders

## 2024-05-02 ENCOUNTER — Other Ambulatory Visit (HOSPITAL_BASED_OUTPATIENT_CLINIC_OR_DEPARTMENT_OTHER): Payer: Self-pay

## 2024-05-04 ENCOUNTER — Other Ambulatory Visit (HOSPITAL_COMMUNITY): Payer: Self-pay

## 2024-05-05 ENCOUNTER — Other Ambulatory Visit (HOSPITAL_COMMUNITY): Payer: Self-pay

## 2024-06-02 ENCOUNTER — Ambulatory Visit (INDEPENDENT_AMBULATORY_CARE_PROVIDER_SITE_OTHER): Payer: Self-pay | Admitting: Plastic Surgery

## 2024-06-02 ENCOUNTER — Other Ambulatory Visit (HOSPITAL_BASED_OUTPATIENT_CLINIC_OR_DEPARTMENT_OTHER): Payer: Self-pay

## 2024-06-02 DIAGNOSIS — Z719 Counseling, unspecified: Secondary | ICD-10-CM

## 2024-06-02 MED ORDER — AMPHETAMINE-DEXTROAMPHETAMINE 20 MG PO TABS
20.0000 mg | ORAL_TABLET | Freq: Three times a day (TID) | ORAL | 0 refills | Status: AC
  Filled 2024-07-04: qty 11, 4d supply, fill #0
  Filled 2024-07-04: qty 79, 27d supply, fill #0
  Filled 2024-07-04: qty 11, 3d supply, fill #0
  Filled 2024-07-04: qty 11, 4d supply, fill #0
  Filled 2024-07-04: qty 79, 26d supply, fill #0

## 2024-06-02 MED ORDER — AMPHETAMINE-DEXTROAMPHETAMINE 20 MG PO TABS
20.0000 mg | ORAL_TABLET | Freq: Three times a day (TID) | ORAL | 0 refills | Status: AC
  Filled 2024-06-02: qty 90, 30d supply, fill #0

## 2024-06-02 MED ORDER — AMPHETAMINE-DEXTROAMPHETAMINE 20 MG PO TABS
20.0000 mg | ORAL_TABLET | Freq: Three times a day (TID) | ORAL | 0 refills | Status: DC
  Filled 2024-08-03: qty 90, 30d supply, fill #0

## 2024-06-02 NOTE — Progress Notes (Signed)
 Preoperative Dx: scarring of face  Postoperative Dx:  same  Procedure: laser to face   Anesthesia: none  Description of Procedure:  Risks and complications were explained to the patient. Consent was confirmed and signed. Eye protection was placed. Time out was called and all information was confirmed to be correct. The area  area was prepped with alcohol and wiped dry. The TRL laser was set at 30. The face was lasered. The patient tolerated the procedure well and there were no complications. The patient is to follow up in 4 weeks. Pronox used.

## 2024-07-04 ENCOUNTER — Other Ambulatory Visit (HOSPITAL_BASED_OUTPATIENT_CLINIC_OR_DEPARTMENT_OTHER): Payer: Self-pay

## 2024-07-07 ENCOUNTER — Other Ambulatory Visit (HOSPITAL_COMMUNITY): Payer: Self-pay

## 2024-07-07 ENCOUNTER — Ambulatory Visit (INDEPENDENT_AMBULATORY_CARE_PROVIDER_SITE_OTHER): Payer: Self-pay | Admitting: Plastic Surgery

## 2024-07-07 ENCOUNTER — Other Ambulatory Visit (HOSPITAL_BASED_OUTPATIENT_CLINIC_OR_DEPARTMENT_OTHER): Payer: Self-pay

## 2024-07-07 DIAGNOSIS — Z719 Counseling, unspecified: Secondary | ICD-10-CM

## 2024-07-07 MED ORDER — LIDOCAINE 23% - TETRACAINE 7% TOPICAL OINTMENT (PLASTICIZED)
1.0000 | TOPICAL_OINTMENT | Freq: Once | CUTANEOUS | 0 refills | Status: AC
  Filled 2024-07-07: qty 60, fill #0
  Filled 2024-07-07: qty 60, 30d supply, fill #0

## 2024-07-07 NOTE — Progress Notes (Signed)
 Preoperative Dx: scarring of face  Postoperative Dx:  same  Procedure: laser to face   Anesthesia: none  Description of Procedure:  Risks and complications were explained to the patient. Consent was confirmed and signed. Eye protection was placed. Time out was called and all information was confirmed to be correct. The area  area was prepped with alcohol and wiped dry. The TRL laser was set at 30. The face was lasered. The patient tolerated the procedure well and there were no complications. The patient is to follow up in 4 weeks. The pronox was used.

## 2024-07-15 ENCOUNTER — Other Ambulatory Visit (HOSPITAL_BASED_OUTPATIENT_CLINIC_OR_DEPARTMENT_OTHER): Payer: Self-pay

## 2024-07-16 ENCOUNTER — Other Ambulatory Visit (HOSPITAL_COMMUNITY): Payer: Self-pay

## 2024-08-03 ENCOUNTER — Other Ambulatory Visit (HOSPITAL_BASED_OUTPATIENT_CLINIC_OR_DEPARTMENT_OTHER): Payer: Self-pay

## 2024-09-03 ENCOUNTER — Other Ambulatory Visit (HOSPITAL_BASED_OUTPATIENT_CLINIC_OR_DEPARTMENT_OTHER): Payer: Self-pay

## 2024-09-18 ENCOUNTER — Ambulatory Visit (INDEPENDENT_AMBULATORY_CARE_PROVIDER_SITE_OTHER): Payer: Self-pay | Admitting: Plastic Surgery

## 2024-09-18 DIAGNOSIS — Z719 Counseling, unspecified: Secondary | ICD-10-CM

## 2024-09-18 NOTE — Progress Notes (Signed)
 Preoperative Dx: scaring of face  Postoperative Dx:  same  Procedure: laser to face   Anesthesia: none  Description of Procedure:  Risks and complications were explained to the patient. Consent was confirmed and signed. Eye protection was placed. Time out was called and all information was confirmed to be correct. The area  area was prepped with alcohol and wiped dry. The TRL laser was set at 40 mJ. The face was lasered. The patient tolerated the procedure well and there were no complications. The patient is to follow up in 4 weeks.

## 2024-10-05 ENCOUNTER — Other Ambulatory Visit (HOSPITAL_BASED_OUTPATIENT_CLINIC_OR_DEPARTMENT_OTHER): Payer: Self-pay

## 2024-10-05 MED ORDER — AMPHETAMINE-DEXTROAMPHETAMINE 20 MG PO TABS
20.0000 mg | ORAL_TABLET | Freq: Three times a day (TID) | ORAL | 0 refills | Status: AC
  Filled 2024-10-05: qty 90, 30d supply, fill #0

## 2024-10-16 ENCOUNTER — Other Ambulatory Visit

## 2024-10-20 ENCOUNTER — Ambulatory Visit (INDEPENDENT_AMBULATORY_CARE_PROVIDER_SITE_OTHER): Payer: Self-pay | Admitting: Plastic Surgery

## 2024-10-20 DIAGNOSIS — Z719 Counseling, unspecified: Secondary | ICD-10-CM

## 2024-10-20 NOTE — Progress Notes (Signed)
 Preoperative Dx: Scarring of face  Postoperative Dx:  same  Procedure: laser to face  Anesthesia: none  Description of Procedure:  Risks and complications were explained to the patient. Consent was confirmed and signed. Eye protection was placed. Time out was called and all information was confirmed to be correct. The area  area was prepped with alcohol and wiped dry. The TRL laser was set at 45 for the edges of the scar area and then 20 on the actual scar area and then between 20 and 30 to feather out from the edges. The patient tolerated the procedure well and there were no complications. The patient is to follow up in 4 weeks.

## 2024-11-04 ENCOUNTER — Other Ambulatory Visit (HOSPITAL_BASED_OUTPATIENT_CLINIC_OR_DEPARTMENT_OTHER): Payer: Self-pay

## 2024-11-04 MED ORDER — AMPHETAMINE-DEXTROAMPHETAMINE 20 MG PO TABS
20.0000 mg | ORAL_TABLET | Freq: Three times a day (TID) | ORAL | 0 refills | Status: DC
  Filled 2024-11-04: qty 90, 30d supply, fill #0

## 2024-12-04 ENCOUNTER — Other Ambulatory Visit (HOSPITAL_BASED_OUTPATIENT_CLINIC_OR_DEPARTMENT_OTHER): Payer: Self-pay

## 2024-12-04 MED ORDER — AMPHETAMINE-DEXTROAMPHETAMINE 20 MG PO TABS
20.0000 mg | ORAL_TABLET | Freq: Three times a day (TID) | ORAL | 0 refills | Status: DC
  Filled 2024-12-04: qty 90, 30d supply, fill #0

## 2024-12-08 ENCOUNTER — Other Ambulatory Visit: Admitting: Plastic Surgery

## 2025-01-04 ENCOUNTER — Other Ambulatory Visit (HOSPITAL_BASED_OUTPATIENT_CLINIC_OR_DEPARTMENT_OTHER): Payer: Self-pay

## 2025-01-04 MED ORDER — AMPHETAMINE-DEXTROAMPHETAMINE 20 MG PO TABS
20.0000 mg | ORAL_TABLET | Freq: Three times a day (TID) | ORAL | 0 refills | Status: AC
  Filled 2025-01-04: qty 90, 30d supply, fill #0

## 2025-01-04 MED ORDER — AMPHETAMINE-DEXTROAMPHETAMINE 20 MG PO TABS: 20.0000 mg | ORAL_TABLET | Freq: Three times a day (TID) | ORAL | 0 refills | Status: AC

## 2025-02-08 ENCOUNTER — Other Ambulatory Visit: Admitting: Plastic Surgery

## 2025-04-27 ENCOUNTER — Other Ambulatory Visit: Admitting: Plastic Surgery
